# Patient Record
Sex: Female | Born: 1945 | Race: Black or African American | Hispanic: No | Smoking: Former smoker
Health system: Southern US, Community
[De-identification: ages and names within clinical notes are randomized; demographics above are authoritative.]

## PROBLEM LIST (undated history)

## (undated) DIAGNOSIS — K219 Gastro-esophageal reflux disease without esophagitis: Secondary | ICD-10-CM

## (undated) DIAGNOSIS — IMO0001 Reserved for inherently not codable concepts without codable children: Secondary | ICD-10-CM

## (undated) DIAGNOSIS — J45909 Unspecified asthma, uncomplicated: Secondary | ICD-10-CM

## (undated) DIAGNOSIS — E119 Type 2 diabetes mellitus without complications: Secondary | ICD-10-CM

## (undated) DIAGNOSIS — I639 Cerebral infarction, unspecified: Secondary | ICD-10-CM

## (undated) DIAGNOSIS — R42 Dizziness and giddiness: Secondary | ICD-10-CM

## (undated) DIAGNOSIS — G589 Mononeuropathy, unspecified: Secondary | ICD-10-CM

## (undated) DIAGNOSIS — I10 Essential (primary) hypertension: Secondary | ICD-10-CM

## (undated) DIAGNOSIS — R7303 Prediabetes: Secondary | ICD-10-CM

## (undated) DIAGNOSIS — S149XXA Injury of unspecified nerves of neck, initial encounter: Secondary | ICD-10-CM

## (undated) DIAGNOSIS — E785 Hyperlipidemia, unspecified: Secondary | ICD-10-CM

## (undated) DIAGNOSIS — K449 Diaphragmatic hernia without obstruction or gangrene: Secondary | ICD-10-CM

## (undated) HISTORY — PX: ABDOMINAL HYSTERECTOMY: SHX81

---

## 2009-08-28 ENCOUNTER — Emergency Department (HOSPITAL_BASED_OUTPATIENT_CLINIC_OR_DEPARTMENT_OTHER): Admission: EM | Admit: 2009-08-28 | Discharge: 2009-08-28 | Payer: Self-pay | Admitting: Emergency Medicine

## 2012-07-19 ENCOUNTER — Emergency Department (HOSPITAL_BASED_OUTPATIENT_CLINIC_OR_DEPARTMENT_OTHER)
Admission: EM | Admit: 2012-07-19 | Discharge: 2012-07-19 | Disposition: A | Discharge status: Home / Self Care | Payer: Self-pay | Attending: Emergency Medicine | Admitting: Emergency Medicine

## 2012-07-19 ENCOUNTER — Encounter (HOSPITAL_BASED_OUTPATIENT_CLINIC_OR_DEPARTMENT_OTHER): Payer: Self-pay | Admitting: *Deleted

## 2012-07-19 DIAGNOSIS — R220 Localized swelling, mass and lump, head: Secondary | ICD-10-CM | POA: Insufficient documentation

## 2012-07-19 DIAGNOSIS — R509 Fever, unspecified: Secondary | ICD-10-CM | POA: Insufficient documentation

## 2012-07-19 DIAGNOSIS — I10 Essential (primary) hypertension: Secondary | ICD-10-CM | POA: Insufficient documentation

## 2012-07-19 DIAGNOSIS — K047 Periapical abscess without sinus: Secondary | ICD-10-CM | POA: Insufficient documentation

## 2012-07-19 DIAGNOSIS — K089 Disorder of teeth and supporting structures, unspecified: Secondary | ICD-10-CM | POA: Insufficient documentation

## 2012-07-19 DIAGNOSIS — R6884 Jaw pain: Secondary | ICD-10-CM | POA: Insufficient documentation

## 2012-07-19 DIAGNOSIS — Z79899 Other long term (current) drug therapy: Secondary | ICD-10-CM | POA: Insufficient documentation

## 2012-07-19 DIAGNOSIS — R221 Localized swelling, mass and lump, neck: Secondary | ICD-10-CM | POA: Insufficient documentation

## 2012-07-19 HISTORY — DX: Essential (primary) hypertension: I10

## 2012-07-19 MED ORDER — OXYCODONE-ACETAMINOPHEN 5-325 MG PO TABS: 1.0000 | ORAL_TABLET | ORAL | Status: DC | PRN

## 2012-07-19 MED ORDER — AMOXICILLIN 500 MG PO CAPS: 500.0000 mg | ORAL_CAPSULE | Freq: Three times a day (TID) | ORAL | Status: DC

## 2012-07-19 MED ORDER — OXYCODONE-ACETAMINOPHEN 5-325 MG PO TABS
2.0000 | ORAL_TABLET | Freq: Once | ORAL | Status: DC
  Filled 2012-07-19: qty 2

## 2012-07-19 MED ORDER — AMOXICILLIN 500 MG PO CAPS: 500.0000 mg | ORAL_CAPSULE | Freq: Once | ORAL | Status: DC

## 2012-07-19 MED ORDER — NAPROXEN 500 MG PO TABS: 500.0000 mg | ORAL_TABLET | Freq: Two times a day (BID) | ORAL | Status: DC

## 2012-07-19 NOTE — ED Notes (Signed)
Pt c/o lower right jaw pain / toothache x 2 days

## 2012-07-20 NOTE — ED Provider Notes (Signed)
History     CSN: [id number]  Arrival date & time 07/19/12  2208   First MD Initiated Contact with Patient 07/19/12 2326      Chief Complaint  Patient presents with  . Dental Pain    (Consider location/radiation/quality/duration/timing/severity/associated sxs/prior treatment) HPI Comments: 66 year old female with a history of hypertension who presents with a complaint of lower jaw pain and swelling. This started approximately 2 days ago, has been persistent, gradually worsening and associated with subjective fever and chills but no nausea and vomiting. She has taken no medication prior to arrival, she denies any fractures of her teeth but notes that she has poor dentition at baseline. She has not seen a dentist in some time.  Patient is a 66 y.o. female presenting with tooth pain. The history is provided by the patient and a relative.  Dental PainThe primary symptoms include fever (subjective).  Additional symptoms include: facial swelling.    Past Medical History  Diagnosis Date  . Hypertension     Past Surgical History  Procedure Date  . Abdominal hysterectomy     History reviewed. No pertinent family history.  History  Substance Use Topics  . Smoking status: Current Every Day Smoker -- 0.5 packs/day  . Smokeless tobacco: Not on file  . Alcohol Use: No    OB History    Grav Para Term Preterm Abortions TAB SAB Ect Mult Living                  Review of Systems  Constitutional: Positive for fever (subjective) and chills.  HENT: Positive for facial swelling.   Gastrointestinal: Negative for nausea and vomiting.    Allergies  Codeine and Sulfa antibiotics  Home Medications   Current Outpatient Rx  Name Route Sig Dispense Refill  . HYDROCHLOROTHIAZIDE 25 MG PO TABS Oral Take 25 mg by mouth daily.    . AMOXICILLIN 500 MG PO CAPS Oral Take 1 capsule (500 mg total) by mouth 3 (three) times daily. 30 capsule 0  . NAPROXEN 500 MG PO TABS Oral Take 1 tablet (500 mg  total) by mouth 2 (two) times daily with a meal. 30 tablet 0  . OXYCODONE-ACETAMINOPHEN 5-325 MG PO TABS Oral Take 1 tablet by mouth every 4 (four) hours as needed for pain. 20 tablet 0    Pulse 97  Temp 99.7 F (37.6 C) (Oral)  Resp 16  Ht 5\' 3"  (1.6 m)  Wt 200 lb (90.719 kg)  BMI 35.43 kg/m2  SpO2 100%  Physical Exam  Nursing note and vitals reviewed. Constitutional: She appears well-developed and well-nourished. No distress.  HENT:  Head: Normocephalic and atraumatic.  Mouth/Throat: Oropharynx is clear and moist. No oropharyngeal exudate.       Dental Disease, swelling to the right lower jaw at the angle of the chin, in the intraoral area there is no fluctuant abscess, dentition is poor, no tenderness under the tongue, no swelling of the posterior pharynx  Eyes: Conjunctivae normal are normal. No scleral icterus.  Neck: Normal range of motion. Neck supple. No thyromegaly present.  Pulmonary/Chest: Effort normal.  Lymphadenopathy:    She has no cervical adenopathy.  Neurological: She is alert. Coordination normal.  Skin: Skin is warm and dry. No rash noted. She is not diaphoretic.    ED Course  Procedures (including critical care time)  Labs Reviewed - No data to display No results found.   1. Dental abscess       MDM  The patient has  a dental abscess which is small and not amenable to incision and drainage. At this time antibiotics are the best option. Vital signs reveal no fever or tachycardia and the patient has been able to swallow without difficulty. Given prescriptions for Naprosyn, amoxicillin and Percocet. Dental followup recommended       Vida Roller, MD 07/20/12 [facility]

## 2013-01-18 ENCOUNTER — Emergency Department (HOSPITAL_BASED_OUTPATIENT_CLINIC_OR_DEPARTMENT_OTHER)
Admission: EM | Admit: 2013-01-18 | Discharge: 2013-01-18 | Disposition: A | Discharge status: Home / Self Care | Payer: Self-pay | Attending: Emergency Medicine | Admitting: Emergency Medicine

## 2013-01-18 ENCOUNTER — Encounter (HOSPITAL_BASED_OUTPATIENT_CLINIC_OR_DEPARTMENT_OTHER): Payer: Self-pay | Admitting: Emergency Medicine

## 2013-01-18 DIAGNOSIS — Z792 Long term (current) use of antibiotics: Secondary | ICD-10-CM | POA: Insufficient documentation

## 2013-01-18 DIAGNOSIS — Z87891 Personal history of nicotine dependence: Secondary | ICD-10-CM | POA: Insufficient documentation

## 2013-01-18 DIAGNOSIS — Z79899 Other long term (current) drug therapy: Secondary | ICD-10-CM | POA: Insufficient documentation

## 2013-01-18 DIAGNOSIS — R51 Headache: Secondary | ICD-10-CM | POA: Insufficient documentation

## 2013-01-18 DIAGNOSIS — R110 Nausea: Secondary | ICD-10-CM | POA: Insufficient documentation

## 2013-01-18 DIAGNOSIS — I10 Essential (primary) hypertension: Secondary | ICD-10-CM | POA: Insufficient documentation

## 2013-01-18 DIAGNOSIS — R519 Headache, unspecified: Secondary | ICD-10-CM

## 2013-01-18 LAB — CBC WITH DIFFERENTIAL/PLATELET
Lymphocytes Relative: 62 % — ABNORMAL HIGH (ref 12–46)
Lymphs Abs: 5.6 10*3/uL — ABNORMAL HIGH (ref 0.7–4.0)
MCV: 82.6 fL (ref 78.0–100.0)
Neutrophils Relative %: 31 % — ABNORMAL LOW (ref 43–77)
Platelets: 297 10*3/uL (ref 150–400)
RBC: 5.22 MIL/uL — ABNORMAL HIGH (ref 3.87–5.11)
WBC: 9.1 10*3/uL (ref 4.0–10.5)

## 2013-01-18 LAB — COMPREHENSIVE METABOLIC PANEL
ALT: 28 U/L (ref 0–35)
Alkaline Phosphatase: 74 U/L (ref 39–117)
CO2: 28 mEq/L (ref 19–32)
GFR calc Af Amer: 77 mL/min — ABNORMAL LOW (ref >90)
GFR calc non Af Amer: 66 mL/min — ABNORMAL LOW (ref >90)
Glucose, Bld: 105 mg/dL — ABNORMAL HIGH (ref 70–99)
Potassium: 3.8 mEq/L (ref 3.5–5.1)
Sodium: 140 mEq/L (ref 135–145)

## 2013-01-18 MED ORDER — PREDNISONE 10 MG PO TABS: 20.0000 mg | ORAL_TABLET | Freq: Two times a day (BID) | ORAL | Status: DC

## 2013-01-18 MED ORDER — HYDROCODONE-ACETAMINOPHEN 5-325 MG PO TABS: 2.0000 | ORAL_TABLET | ORAL | Status: DC | PRN

## 2013-01-18 NOTE — ED Provider Notes (Signed)
History     CSN: [id number]  Arrival date & time 01/18/13  1911   First MD Initiated Contact with Patient 01/18/13 1927      Chief Complaint  Patient presents with  . Headache  . Nausea    (Consider location/radiation/quality/duration/timing/severity/associated sxs/prior treatment) HPI Comments: Patient presents with complaints of intermittent, sharp pains that occur in the back of her head and radiate into her neck and feet.  This has happened several times over the past several months.  It seems to last about 15 minutes but nothing aggravates or alleviates the symptoms.  She has been seen at the ER and by her pcp for this and has had a ct scan of her head and labs, only revealing a wbc count of 11k.    Patient is a 67 y.o. female presenting with headaches. The history is provided by the patient.  Headache Pain location:  Occipital Quality:  Sharp Radiates to: neck and feet. Onset quality:  Sudden Timing:  Intermittent Progression:  Worsening   Past Medical History  Diagnosis Date  . Hypertension     Past Surgical History  Procedure Laterality Date  . Abdominal hysterectomy      No family history on file.  History  Substance Use Topics  . Smoking status: Former Smoker -- 0.50 packs/day    Quit date: 12/21/2012  . Smokeless tobacco: Not on file  . Alcohol Use: No    OB History   Grav Para Term Preterm Abortions TAB SAB Ect Mult Living                  Review of Systems  Neurological: Positive for headaches.  All other systems reviewed and are negative.    Allergies  Codeine; Penicillins; and Sulfa antibiotics  Home Medications   Current Outpatient Rx  Name  Route  Sig  Dispense  Refill  . hydrochlorothiazide (HYDRODIURIL) 25 MG tablet   Oral   Take 25 mg by mouth daily.         Marland Kitchen lisinopril (PRINIVIL,ZESTRIL) 10 MG tablet   Oral   Take 10 mg by mouth daily.         Marland Kitchen amoxicillin (AMOXIL) 500 MG capsule   Oral   Take 1 capsule (500 mg  total) by mouth 3 (three) times daily.   30 capsule   0   . naproxen (NAPROSYN) 500 MG tablet   Oral   Take 1 tablet (500 mg total) by mouth 2 (two) times daily with a meal.   30 tablet   0   . oxyCODONE-acetaminophen (PERCOCET) 5-325 MG per tablet   Oral   Take 1 tablet by mouth every 4 (four) hours as needed for pain.   20 tablet   0     BP 116/62  Pulse 71  Temp(Src) 98 F (36.7 C) (Oral)  Resp 16  Ht 5\' 2"  (1.575 m)  Wt 178 lb (80.74 kg)  BMI 32.55 kg/m2  SpO2 100%  Physical Exam  Nursing note and vitals reviewed. Constitutional: She is oriented to person, place, and time. She appears well-developed and well-nourished. No distress.  HENT:  Head: Normocephalic and atraumatic.  Eyes: EOM are normal. Pupils are equal, round, and reactive to light.  Neck: Normal range of motion. Neck supple.  Cardiovascular: Normal rate and regular rhythm.  Exam reveals no gallop and no friction rub.   No murmur heard. Pulmonary/Chest: Effort normal and breath sounds normal. No respiratory distress. She has no wheezes.  Abdominal: Soft. Bowel sounds are normal. She exhibits no distension. There is no tenderness.  Musculoskeletal: Normal range of motion.  Neurological: She is alert and oriented to person, place, and time. No cranial nerve deficit. She exhibits normal muscle tone. Coordination normal.  Skin: Skin is warm and dry. She is not diaphoretic.    ED Course  Procedures (including critical care time)  Labs Reviewed - No data to display No results found.   No diagnosis found.   Date: 01/18/2013  Rate: 68  Rhythm: normal sinus rhythm  QRS Axis: normal  Intervals: normal  ST/T Wave abnormalities: normal  Conduction Disutrbances:none  Narrative Interpretation:   Old EKG Reviewed: unchanged    MDM  The patient presents with intermittent sharp, shooting pains in the back of her head.  I am unable to find a reason for this.  The labs, ekg, and ct at the outside  facility are unremarkable.  She is to see a neurologist in the near future and I have recommended that she makes sure this happens.  Will discharge to home, return prn.        Geoffery Lyons, MD 01/18/13 2107

## 2013-01-18 NOTE — ED Notes (Signed)
Sent to [facility] for Head CT result that was done yesterday per pt.

## 2013-01-18 NOTE — ED Notes (Signed)
Intermittent posterior HA radiating down back of neck with dizziness and nausea.  Lost consciousness x1 6 days ago.  Sts she was evaluated [facility] and had a "scan and blood work".  Pt was told that all tests were negative except WBC 11.  Another episode today.

## 2013-02-18 ENCOUNTER — Encounter (HOSPITAL_BASED_OUTPATIENT_CLINIC_OR_DEPARTMENT_OTHER): Payer: Self-pay | Admitting: *Deleted

## 2013-02-18 ENCOUNTER — Emergency Department (HOSPITAL_BASED_OUTPATIENT_CLINIC_OR_DEPARTMENT_OTHER): Payer: Self-pay

## 2013-02-18 ENCOUNTER — Emergency Department (HOSPITAL_BASED_OUTPATIENT_CLINIC_OR_DEPARTMENT_OTHER)
Admission: EM | Admit: 2013-02-18 | Discharge: 2013-02-18 | Disposition: A | Discharge status: Home / Self Care | Payer: Self-pay | Attending: Emergency Medicine | Admitting: Emergency Medicine

## 2013-02-18 DIAGNOSIS — R6883 Chills (without fever): Secondary | ICD-10-CM | POA: Insufficient documentation

## 2013-02-18 DIAGNOSIS — Z79899 Other long term (current) drug therapy: Secondary | ICD-10-CM | POA: Insufficient documentation

## 2013-02-18 DIAGNOSIS — R110 Nausea: Secondary | ICD-10-CM | POA: Insufficient documentation

## 2013-02-18 DIAGNOSIS — R42 Dizziness and giddiness: Secondary | ICD-10-CM | POA: Insufficient documentation

## 2013-02-18 DIAGNOSIS — Z87891 Personal history of nicotine dependence: Secondary | ICD-10-CM | POA: Insufficient documentation

## 2013-02-18 DIAGNOSIS — R079 Chest pain, unspecified: Secondary | ICD-10-CM | POA: Insufficient documentation

## 2013-02-18 DIAGNOSIS — IMO0002 Reserved for concepts with insufficient information to code with codable children: Secondary | ICD-10-CM | POA: Insufficient documentation

## 2013-02-18 DIAGNOSIS — M5412 Radiculopathy, cervical region: Secondary | ICD-10-CM

## 2013-02-18 DIAGNOSIS — Z8669 Personal history of other diseases of the nervous system and sense organs: Secondary | ICD-10-CM | POA: Insufficient documentation

## 2013-02-18 DIAGNOSIS — I10 Essential (primary) hypertension: Secondary | ICD-10-CM | POA: Insufficient documentation

## 2013-02-18 HISTORY — DX: Dizziness and giddiness: R42

## 2013-02-18 LAB — COMPREHENSIVE METABOLIC PANEL
AST: 23 U/L (ref 0–37)
Albumin: 3.7 g/dL (ref 3.5–5.2)
Chloride: 105 mEq/L (ref 96–112)
Creatinine, Ser: 0.8 mg/dL (ref 0.50–1.10)
Potassium: 4.3 mEq/L (ref 3.5–5.1)
Total Bilirubin: 0.5 mg/dL (ref 0.3–1.2)
Total Protein: 7.2 g/dL (ref 6.0–8.3)

## 2013-02-18 LAB — CBC WITH DIFFERENTIAL/PLATELET
Basophils Absolute: 0 10*3/uL (ref 0.0–0.1)
Basophils Relative: 1 % (ref 0–1)
MCHC: 33.5 g/dL (ref 30.0–36.0)
Monocytes Absolute: 0.4 10*3/uL (ref 0.1–1.0)
Neutro Abs: 1.8 10*3/uL (ref 1.7–7.7)
Neutrophils Relative %: 31 % — ABNORMAL LOW (ref 43–77)
RDW: 16.2 % — ABNORMAL HIGH (ref 11.5–15.5)

## 2013-02-18 LAB — D-DIMER, QUANTITATIVE: D-Dimer, Quant: 0.38 ug/mL-FEU (ref 0.00–0.48)

## 2013-02-18 LAB — URINALYSIS, ROUTINE W REFLEX MICROSCOPIC
Glucose, UA: NEGATIVE mg/dL
Ketones, ur: NEGATIVE mg/dL
Leukocytes, UA: NEGATIVE
Specific Gravity, Urine: 1.018 (ref 1.005–1.030)
pH: 7.5 (ref 5.0–8.0)

## 2013-02-18 MED ORDER — OXYCODONE-ACETAMINOPHEN 5-325 MG PO TABS: 2.0000 | ORAL_TABLET | ORAL | Status: DC | PRN

## 2013-02-18 MED ORDER — IBUPROFEN 800 MG PO TABS: 800.0000 mg | ORAL_TABLET | Freq: Three times a day (TID) | ORAL | Status: DC

## 2013-02-18 MED ORDER — METHYLPREDNISOLONE 4 MG PO KIT: PACK | ORAL | Status: DC

## 2013-02-18 NOTE — ED Notes (Signed)
Ambulated patient as ordered. SAT was 96% in bed before we started, and increased to 99% with walking. Patient stated that she felt ok with walking. RT will continue to monitor.

## 2013-02-18 NOTE — ED Notes (Signed)
MD at bedside. 

## 2013-02-18 NOTE — ED Provider Notes (Signed)
History    This chart was scribed for Glynn Octave, MD by Leone Payor, ED Scribe. This patient was seen in room MH11/MH11 and the patient's care was started 3:23 PM.   CSN: [id number]  Arrival date & time 02/18/13  1400   First MD Initiated Contact with Patient 02/18/13 1459      Chief Complaint  Patient presents with  . Shortness of Breath  . Chills  . Chest Pain     The history is provided by the patient. No language interpreter was used.    Donna Frank is a 67 y.o. female who presents to the Emergency Department complaining of new, sudden onset of intermittent SOB that started about 1 hour ago. Pt states she feels tightness in her chest but no pain. Pt also has left neck pain which she believes is from a pinched nerve that has been going on for a while. States the chest tightness also has been associated with the nerve pain but it comes and goes. Pt states she sometimes feels lightheadedness but no dizziness. She has coughing and occasional nausea. She denies chest pain, fever, runny nose, sore throat, change in bowel or bladder function, weakness in the left arm. Pt has h/o HTN. Denies heart and lung problems. She denies smoking. Denies taking any long plane or car trips. Pt is eating and drinking normally.   Pt is a former smoker but denies alcohol use. Past Medical History  Diagnosis Date  . Hypertension   . Vertigo     Past Surgical History  Procedure Laterality Date  . Abdominal hysterectomy      History reviewed. No pertinent family history.  History  Substance Use Topics  . Smoking status: Former Smoker -- 0.50 packs/day    Quit date: 12/21/2012  . Smokeless tobacco: Not on file  . Alcohol Use: No    No OB history provided.   Review of Systems A complete 10 system review of systems was obtained and all systems are negative except as noted in the HPI and PMH.   Allergies  Codeine; Penicillins; and Sulfa antibiotics  Home Medications   Current  Outpatient Rx  Name  Route  Sig  Dispense  Refill  . meclizine (ANTIVERT) 25 MG tablet   Oral   Take 25 mg by mouth 3 (three) times daily as needed.         Marland Kitchen amoxicillin (AMOXIL) 500 MG capsule   Oral   Take 1 capsule (500 mg total) by mouth 3 (three) times daily.   30 capsule   0   . hydrochlorothiazide (HYDRODIURIL) 25 MG tablet   Oral   Take 25 mg by mouth daily.         Marland Kitchen HYDROcodone-acetaminophen (NORCO) 5-325 MG per tablet   Oral   Take 2 tablets by mouth every 4 (four) hours as needed for pain.   20 tablet   0   . ibuprofen (ADVIL,MOTRIN) 800 MG tablet   Oral   Take 1 tablet (800 mg total) by mouth 3 (three) times daily.   21 tablet   0   . lisinopril (PRINIVIL,ZESTRIL) 10 MG tablet   Oral   Take 10 mg by mouth daily.         . methylPREDNISolone (MEDROL, PAK,) 4 MG tablet      follow package directions   21 tablet   0   . naproxen (NAPROSYN) 500 MG tablet   Oral   Take 1 tablet (500 mg total) by  mouth 2 (two) times daily with a meal.   30 tablet   0   . oxyCODONE-acetaminophen (PERCOCET) 5-325 MG per tablet   Oral   Take 1 tablet by mouth every 4 (four) hours as needed for pain.   20 tablet   0   . oxyCODONE-acetaminophen (PERCOCET/ROXICET) 5-325 MG per tablet   Oral   Take 2 tablets by mouth every 4 (four) hours as needed for pain.   15 tablet   0   . predniSONE (DELTASONE) 10 MG tablet   Oral   Take 2 tablets (20 mg total) by mouth 2 (two) times daily.   20 tablet   0     BP 132/76  Pulse 75  Temp(Src) 98.5 F (36.9 C) (Oral)  Ht 5\' 2"  (1.575 m)  Wt 184 lb (83.462 kg)  BMI 33.65 kg/m2  SpO2 99%  Physical Exam  Nursing note and vitals reviewed. Constitutional: She is oriented to person, place, and time. She appears well-developed and well-nourished. No distress.  HENT:  Head: Normocephalic and atraumatic.  Eyes: EOM are normal.  Neck: Neck supple. No tracheal deviation present.  Cardiovascular: Normal rate.    Pulmonary/Chest: Effort normal and breath sounds normal. No respiratory distress.  Musculoskeletal: Normal range of motion.  Left paraspinal cervical pain. equal grip strength bilaterally. 5/5 strength in bilateral lower extremities. Ankle plantar and dorsiflexion intact. Great toe extension intact bilaterally. +2 DP and PT pulses. +2 patellar reflexes bilaterally. Normal gait.    Neurological: She is alert and oriented to person, place, and time.  cranial nerves 2-12 intact. 5/5 strength throughout, no ataxia on finger to nose. Romberg neg. Normal gait. No nystagmus.   Skin: Skin is warm and dry.  Psychiatric: She has a normal mood and affect. Her behavior is normal.    ED Course  Procedures (including critical care time)  DIAGNOSTIC STUDIES: Oxygen Saturation is 96% on room air, adequate by my interpretation.    COORDINATION OF CARE: 3:28 PM-Discussed treatment plan with pt at bedside and pt agreed to plan.    4:28PM-Informed pt of lab and radiology reports. Pt advised to follow up with a neurosurgeon. Pt will be given steroids and antiinflammatory medication.   Labs Reviewed  CBC WITH DIFFERENTIAL - Abnormal; Notable for the following:    RDW 16.2 (*)    Neutrophils Relative 31 (*)    Lymphocytes Relative 60 (*)    All other components within normal limits  COMPREHENSIVE METABOLIC PANEL - Abnormal; Notable for the following:    GFR calc non Af Amer 76 (*)    GFR calc Af Amer 88 (*)    All other components within normal limits  TROPONIN I  URINALYSIS, ROUTINE W REFLEX MICROSCOPIC  D-DIMER, QUANTITATIVE   Dg Chest 2 View  02/18/2013  *RADIOLOGY REPORT*  Clinical Data: Shortness of breath and chills  CHEST - 2 VIEW  Comparison: None  Findings: The heart size and mediastinal contours are within normal limits.  Both lungs are clear.  The visualized skeletal structures are unremarkable.  IMPRESSION: Negative examination.   Original Report Authenticated By: Signa Kell, M.D.     Ct Cervical Spine Wo Contrast  02/18/2013  *RADIOLOGY REPORT*  Clinical Data: Left-sided neck pain and numbness in the left upper extremity.  CT CERVICAL SPINE WITHOUT CONTRAST  Technique:  Multidetector CT imaging of the cervical spine was performed. Multiplanar CT image reconstructions were also generated.  Comparison: None.  Findings: Mild multilevel cervical spondylosis at C4-5, C5-6  and C6- 7 with central disc bulges/protrusions evident on soft tissue windows.  Proliferative changes are also present at multiple levels.  No evidence of fracture or subluxation.  No soft tissue abnormalities.  IMPRESSION: Mild multilevel cervical spondylosis, as above.  Soft tissue windows suggest potential disc bulges/protrusions which are not very well delineated by CT.  Consider cervical MRI if there are persistent radicular symptoms.   Original Report Authenticated By: Irish Lack, M.D.      1. Cervical radiculopathy       MDM  Onset of chills and shortness of breath today. No chest pain, cough or fever. Complains of left-sided neck pain which she says is due to a pinched nerve that has been going on for "a long time. No grip strength weakness, fever, chills, nausea or vomiting.  Chest x-ray negative. EKG unchanged. Troponin and d-dimer negative. Patient ambulatory without desaturation. Suspect the sensation she had was due to her pinched nerve in her left neck. She is no grip strength weakness has not been dropping objects.  Do not suspect ACS or PE. We'll treat for cervical radiculopathy. She has followup with neurology and neurosurgery scheduled.   Date: 02/18/2013  Rate: 81  Rhythm: normal sinus rhythm  QRS Axis: normal  Intervals: normal  ST/T Wave abnormalities: normal  Conduction Disutrbances:none  Narrative Interpretation: septal q waves  Old EKG Reviewed: unchanged    I personally performed the services described in this documentation, which was scribed in my presence. The recorded  information has been reviewed and is accurate.     Glynn Octave, MD 02/18/13 [phone]

## 2013-02-18 NOTE — ED Notes (Signed)
Pt to triage in w/c c/o "feeling cold all over" and sudden onset of sob x 1 hour ago. Pt also c/o left neck pain, "I have a pinched nerve, that's been going on for a while now..."

## 2013-02-18 NOTE — ED Notes (Signed)
Patient ambulated to and from restroom without difficulty.

## 2013-03-07 ENCOUNTER — Encounter (HOSPITAL_BASED_OUTPATIENT_CLINIC_OR_DEPARTMENT_OTHER): Payer: Self-pay | Admitting: Emergency Medicine

## 2013-03-07 ENCOUNTER — Emergency Department (HOSPITAL_BASED_OUTPATIENT_CLINIC_OR_DEPARTMENT_OTHER)
Admission: EM | Admit: 2013-03-07 | Discharge: 2013-03-08 | Disposition: A | Discharge status: Home / Self Care | Payer: BC Managed Care – PPO | Attending: Emergency Medicine | Admitting: Emergency Medicine

## 2013-03-07 DIAGNOSIS — I10 Essential (primary) hypertension: Secondary | ICD-10-CM | POA: Insufficient documentation

## 2013-03-07 DIAGNOSIS — Z79899 Other long term (current) drug therapy: Secondary | ICD-10-CM | POA: Insufficient documentation

## 2013-03-07 DIAGNOSIS — M5412 Radiculopathy, cervical region: Secondary | ICD-10-CM

## 2013-03-07 DIAGNOSIS — Z8669 Personal history of other diseases of the nervous system and sense organs: Secondary | ICD-10-CM | POA: Insufficient documentation

## 2013-03-07 DIAGNOSIS — F411 Generalized anxiety disorder: Secondary | ICD-10-CM | POA: Insufficient documentation

## 2013-03-07 DIAGNOSIS — E785 Hyperlipidemia, unspecified: Secondary | ICD-10-CM | POA: Insufficient documentation

## 2013-03-07 DIAGNOSIS — Z880 Allergy status to penicillin: Secondary | ICD-10-CM | POA: Insufficient documentation

## 2013-03-07 DIAGNOSIS — Z87891 Personal history of nicotine dependence: Secondary | ICD-10-CM | POA: Insufficient documentation

## 2013-03-07 DIAGNOSIS — F410 Panic disorder [episodic paroxysmal anxiety] without agoraphobia: Secondary | ICD-10-CM

## 2013-03-07 HISTORY — DX: Hyperlipidemia, unspecified: E78.5

## 2013-03-07 NOTE — ED Notes (Signed)
C/o SHOB and neck pain for "months".  Seen here in the past for same and told she has a pinched nerve.  Daughter thinks she is having panic attacks.  Appt with neurology on 03/20/13.

## 2013-03-07 NOTE — ED Provider Notes (Signed)
History     CSN: [id number]  Arrival date & time 03/07/13  2143   First MD Initiated Contact with Patient 03/07/13 2346      Chief Complaint  Patient presents with  . Neck Pain    (Consider location/radiation/quality/duration/timing/severity/associated sxs/prior treatment) HPI This is a 67 year old female with several months of neck pain. The pain is located in the left neck and radiates to her left ear, her left scapula and her left shoulder. It is episodic and triggered by lifting with her left arm. It has not significantly affected by movement of the neck itself. She describes the pain is sharp and moderate to severe. She has an appointment with a neurologist on May 21. She has been taking ibuprofen, naproxen and/or oxycodone with relief.  She is also here because she's been having panic attacks. These are triggered by being alone. If she knows her son is going to work and she will be a home alone this will trigger an attack. Episodes of neck pain also trigger her attacks. Sometimes the attacks occur without obvious provocation. The attacks are characterized as feeling short of breath, very anxious with paresthesias in the fingertips. She had a workup in the ED on April 21 which showed no evidence of acute cardiac or pulmonary disease. She was discharged with diagnosis of cervical radiculopathy.  Past Medical History  Diagnosis Date  . Hypertension   . Vertigo   . Hyperlipidemia     Past Surgical History  Procedure Laterality Date  . Abdominal hysterectomy      No family history on file.  History  Substance Use Topics  . Smoking status: Former Smoker -- 0.50 packs/day    Quit date: 12/21/2012  . Smokeless tobacco: Not on file  . Alcohol Use: No    OB History   Grav Para Term Preterm Abortions TAB SAB Ect Mult Living                  Review of Systems  All other systems reviewed and are negative.    Allergies  Codeine; Penicillins; and Sulfa antibiotics  Home  Medications   Current Outpatient Rx  Name  Route  Sig  Dispense  Refill  . amoxicillin (AMOXIL) 500 MG capsule   Oral   Take 1 capsule (500 mg total) by mouth 3 (three) times daily.   30 capsule   0   . hydrochlorothiazide (HYDRODIURIL) 25 MG tablet   Oral   Take 25 mg by mouth daily.         Marland Kitchen HYDROcodone-acetaminophen (NORCO) 5-325 MG per tablet   Oral   Take 2 tablets by mouth every 4 (four) hours as needed for pain.   20 tablet   0   . ibuprofen (ADVIL,MOTRIN) 800 MG tablet   Oral   Take 1 tablet (800 mg total) by mouth 3 (three) times daily.   21 tablet   0   . lisinopril (PRINIVIL,ZESTRIL) 10 MG tablet   Oral   Take 10 mg by mouth daily.         . meclizine (ANTIVERT) 25 MG tablet   Oral   Take 25 mg by mouth 3 (three) times daily as needed.         . methylPREDNISolone (MEDROL, PAK,) 4 MG tablet      follow package directions   21 tablet   0   . naproxen (NAPROSYN) 500 MG tablet   Oral   Take 1 tablet (500 mg total) by  mouth 2 (two) times daily with a meal.   30 tablet   0   . oxyCODONE-acetaminophen (PERCOCET) 5-325 MG per tablet   Oral   Take 1 tablet by mouth every 4 (four) hours as needed for pain.   20 tablet   0   . oxyCODONE-acetaminophen (PERCOCET/ROXICET) 5-325 MG per tablet   Oral   Take 2 tablets by mouth every 4 (four) hours as needed for pain.   15 tablet   0   . predniSONE (DELTASONE) 10 MG tablet   Oral   Take 2 tablets (20 mg total) by mouth 2 (two) times daily.   20 tablet   0     BP 158/67  Pulse 77  Temp(Src) 98.5 F (36.9 C) (Oral)  Resp 18  Ht 5\' 2"  (1.575 m)  Wt 180 lb (81.647 kg)  BMI 32.91 kg/m2  SpO2 98%  Physical Exam General: Well-developed, well-nourished female in no acute distress; appearance consistent with age of record HENT: normocephalic, atraumatic Eyes: pupils equal round and reactive to light; extraocular muscles intact Neck: supple; pain when lifting with left arm Heart: regular rate  and rhythm Lungs: clear to auscultation bilaterally Abdomen: soft; nondistended Extremities: No deformity; full range of motion Neurologic: Awake, alert and oriented; motor function intact in all extremities and symmetric; no facial droop Skin: Warm and dry Psychiatric: Mildly anxious    ED Course  Procedures (including critical care time)    MDM  We will schedule the patient for outpatient MRI and anticipation of her neurology appointment. We'll prescribe a mild anxiolytic for use as needed.        Hanley Seamen, MD 03/08/13 [phone]

## 2013-03-08 MED ORDER — OXYCODONE-ACETAMINOPHEN 5-325 MG PO TABS: 1.0000 | ORAL_TABLET | Freq: Four times a day (QID) | ORAL | Status: DC | PRN

## 2013-03-08 MED ORDER — ALPRAZOLAM 0.25 MG PO TABS: 0.2500 mg | ORAL_TABLET | Freq: Three times a day (TID) | ORAL | Status: DC | PRN

## 2013-03-13 MED ORDER — LORAZEPAM 1 MG PO TABS: 1.0000 mg | ORAL_TABLET | Freq: Once | ORAL | Status: DC

## 2013-03-13 NOTE — ED Notes (Signed)
XR tech sts pt called and is claustrophobic and needs medication to have mri this Saturday. Dr Rosalia Hammers reviewed chart and prescription for ativan provided by Dr. Rosalia Hammers for pt's procedure. Pt called and notified of prescription that will be waiting for her in envelope with name on it. Pt aware she will need to provide photo ID and sts she will come to pick it up later today.

## 2013-03-16 ENCOUNTER — Ambulatory Visit (HOSPITAL_BASED_OUTPATIENT_CLINIC_OR_DEPARTMENT_OTHER): Payer: BC Managed Care – PPO

## 2013-05-21 ENCOUNTER — Emergency Department (HOSPITAL_BASED_OUTPATIENT_CLINIC_OR_DEPARTMENT_OTHER): Payer: BC Managed Care – PPO

## 2013-05-21 ENCOUNTER — Emergency Department (HOSPITAL_BASED_OUTPATIENT_CLINIC_OR_DEPARTMENT_OTHER)
Admission: EM | Admit: 2013-05-21 | Discharge: 2013-05-21 | Disposition: A | Discharge status: Home / Self Care | Payer: BC Managed Care – PPO | Attending: Emergency Medicine | Admitting: Emergency Medicine

## 2013-05-21 ENCOUNTER — Encounter (HOSPITAL_BASED_OUTPATIENT_CLINIC_OR_DEPARTMENT_OTHER): Payer: Self-pay | Admitting: *Deleted

## 2013-05-21 DIAGNOSIS — Z8669 Personal history of other diseases of the nervous system and sense organs: Secondary | ICD-10-CM | POA: Insufficient documentation

## 2013-05-21 DIAGNOSIS — Z79899 Other long term (current) drug therapy: Secondary | ICD-10-CM | POA: Insufficient documentation

## 2013-05-21 DIAGNOSIS — I10 Essential (primary) hypertension: Secondary | ICD-10-CM | POA: Insufficient documentation

## 2013-05-21 DIAGNOSIS — Z880 Allergy status to penicillin: Secondary | ICD-10-CM | POA: Insufficient documentation

## 2013-05-21 DIAGNOSIS — M25519 Pain in unspecified shoulder: Secondary | ICD-10-CM | POA: Insufficient documentation

## 2013-05-21 DIAGNOSIS — M25512 Pain in left shoulder: Secondary | ICD-10-CM

## 2013-05-21 DIAGNOSIS — Z87891 Personal history of nicotine dependence: Secondary | ICD-10-CM | POA: Insufficient documentation

## 2013-05-21 DIAGNOSIS — Z862 Personal history of diseases of the blood and blood-forming organs and certain disorders involving the immune mechanism: Secondary | ICD-10-CM | POA: Insufficient documentation

## 2013-05-21 DIAGNOSIS — R079 Chest pain, unspecified: Secondary | ICD-10-CM

## 2013-05-21 DIAGNOSIS — Z8639 Personal history of other endocrine, nutritional and metabolic disease: Secondary | ICD-10-CM | POA: Insufficient documentation

## 2013-05-21 HISTORY — DX: Mononeuropathy, unspecified: G58.9

## 2013-05-21 HISTORY — DX: Injury of unspecified nerves of neck, initial encounter: S14.9XXA

## 2013-05-21 MED ORDER — OXYCODONE-ACETAMINOPHEN 5-325 MG PO TABS: 1.0000 | ORAL_TABLET | ORAL | Status: DC | PRN

## 2013-05-21 MED ORDER — OXYCODONE-ACETAMINOPHEN 5-325 MG PO TABS
2.0000 | ORAL_TABLET | Freq: Once | ORAL | Status: AC
  Administered 2013-05-21: 2 via ORAL
  Filled 2013-05-21 (×2): qty 2

## 2013-05-21 NOTE — ED Provider Notes (Signed)
History    CSN: [id number] Arrival date & time 05/21/13  0205  First MD Initiated Contact with Patient 05/21/13 [phone]     Chief Complaint  Patient presents with  . Back Pain    Patient is a 67 y.o. female presenting with back pain. The history is provided by the patient.  Back Pain Pain location: left scapular region. Quality:  Stabbing Radiates to: left chest. Pain severity:  Moderate Pain is:  Same all the time Duration:  1 day Timing:  Constant Progression:  Worsening Chronicity:  Recurrent Relieved by:  Nothing Worsened by:  Deep breathing, touching and movement Associated symptoms: chest pain   Associated symptoms: no abdominal pain, no fever and no weakness   pt reports she has had left scapular pain that radiates to left chest that has been present for one day.  It has been constant for 24 hours.  No trauma/injury.  Pain seems worse with deep breathing and movement/palpation She reports mild SOB when pain is severe No cough No focal weakness No abdominal pain She reports recent 3 day stay at [facility] regional and had full cardiac workup for similar type pain She denies known h/o CAD/PE  Past Medical History  Diagnosis Date  . Hypertension   . Vertigo   . Hyperlipidemia   . Pinched nerve in neck     neck and shoulder   Past Surgical History  Procedure Laterality Date  . Abdominal hysterectomy     History reviewed. No pertinent family history. History  Substance Use Topics  . Smoking status: Former Smoker -- 0.50 packs/day    Quit date: 12/21/2012  . Smokeless tobacco: Not on file  . Alcohol Use: No   OB History   Grav Para Term Preterm Abortions TAB SAB Ect Mult Living                 Review of Systems  Constitutional: Negative for fever.  Cardiovascular: Positive for chest pain. Negative for leg swelling.  Gastrointestinal: Negative for vomiting and abdominal pain.  Musculoskeletal: Positive for back pain.  Neurological: Negative for weakness.   All other systems reviewed and are negative.    Allergies  Codeine; Penicillins; and Sulfa antibiotics  Home Medications   Current Outpatient Rx  Name  Route  Sig  Dispense  Refill  . ALPRAZolam (XANAX) 0.25 MG tablet   Oral   Take 1 tablet (0.25 mg total) by mouth 3 (three) times daily as needed for anxiety.   20 tablet   0   . esomeprazole (NEXIUM) 40 MG capsule   Oral   Take 40 mg by mouth daily before breakfast.         . hydrochlorothiazide (HYDRODIURIL) 25 MG tablet   Oral   Take 25 mg by mouth daily.         Marland Kitchen lisinopril (PRINIVIL,ZESTRIL) 10 MG tablet   Oral   Take 10 mg by mouth daily.         . tizanidine (ZANAFLEX) 2 MG capsule   Oral   Take 2 mg by mouth 3 (three) times daily.         Marland Kitchen amoxicillin (AMOXIL) 500 MG capsule   Oral   Take 1 capsule (500 mg total) by mouth 3 (three) times daily.   30 capsule   0   . HYDROcodone-acetaminophen (NORCO) 5-325 MG per tablet   Oral   Take 2 tablets by mouth every 4 (four) hours as needed for pain.  20 tablet   0   . ibuprofen (ADVIL,MOTRIN) 800 MG tablet   Oral   Take 1 tablet (800 mg total) by mouth 3 (three) times daily.   21 tablet   0   . LORazepam (ATIVAN) 1 MG tablet   Oral   Take 1 tablet (1 mg total) by mouth once. Take two mg 30 minutes prior to arrival for mri.  Do not drive and stay accompanied by other person for 24 hours after taking.   2 tablet   0   . meclizine (ANTIVERT) 25 MG tablet   Oral   Take 25 mg by mouth 3 (three) times daily as needed.         . methylPREDNISolone (MEDROL, PAK,) 4 MG tablet      follow package directions   21 tablet   0   . naproxen (NAPROSYN) 500 MG tablet   Oral   Take 1 tablet (500 mg total) by mouth 2 (two) times daily with a meal.   30 tablet   0   . oxyCODONE-acetaminophen (PERCOCET) 5-325 MG per tablet   Oral   Take 1 tablet by mouth every 4 (four) hours as needed for pain.   20 tablet   0   . oxyCODONE-acetaminophen  (PERCOCET/ROXICET) 5-325 MG per tablet   Oral   Take 1-2 tablets by mouth every 6 (six) hours as needed for pain.   20 tablet   0   . predniSONE (DELTASONE) 10 MG tablet   Oral   Take 2 tablets (20 mg total) by mouth 2 (two) times daily.   20 tablet   0    BP 155/77  Pulse 76  Temp(Src) 98.7 F (37.1 C) (Oral)  Resp 19  Ht 5\' 2"  (1.575 m)  Wt 184 lb (83.462 kg)  BMI 33.65 kg/m2  SpO2 98% Physical Exam CONSTITUTIONAL: Well developed/well nourished HEAD: Normocephalic/atraumatic EYES: EOMI/PERRL ENMT: Mucous membranes moist NECK: supple no meningeal signs SPINE:entire spine nontender CV: S1/S2 noted, no murmurs/rubs/gallops noted Chest - tender to left chest, no bruising or crepitance LUNGS: Lungs are clear to auscultation bilaterally, no apparent distress ABDOMEN: soft, nontender, no rebound or guarding GU:no cva tenderness NEURO: Pt is awake/alert, moves all extremitiesx4 EXTREMITIES: pulses normal, full ROM. Tender to left scapula but no deformity or bruising.  She has full ROM of left shoulder. She is tender along left trapezius.   SKIN: warm, color normal PSYCH: no abnormalities of mood noted  ED Course  Procedures  2:55 AM Awaiting records from [facility] 3:28 AM Records from [facility] reviewed Pt had admission earlier this month for SOB She had cardiac evaluation with negative troponins as well as negative cardiac imaging though it was at rest only as pt refused stress imaging.  She also had EGD that showed small hiatal hernia 3:48 AM Pt now admits some of this pain is similar to prior episodes of "pinched nerve" in her neck causing pain into left arm. She has no new weakness in her arm.  Her CP continues to be reproducible.  With recent negative workup at [facility] and her exam/history, I don't feel further workup necessary.  I doubt ACS/Pe/Dissection at this time Short course of pain meds given We discussed strict return precautions  MDM  Nursing notes  including past medical history and social history reviewed and considered in documentation xrays reviewed and considered     Date: 05/21/2013 0238am  Rate: 75  Rhythm: normal sinus rhythm  QRS Axis: normal  Intervals: normal  ST/T Wave abnormalities: nonspecific ST changes  Conduction Disutrbances:none  Narrative Interpretation:   Old EKG Reviewed: unchanged        Joya Gaskins, MD 05/21/13 [phone]

## 2013-05-21 NOTE — ED Notes (Addendum)
medicated per order for pain, Dr Bebe Shaggy into see pt pain dx as musculoskeletal after review of [facility] records and current test results.

## 2013-05-21 NOTE — ED Notes (Signed)
Pt c/o upper back pain for few days, was seen by PA at [facility] for same but states she can't take the meds they prescribed her. This is a chronic issue.

## 2013-05-21 NOTE — ED Notes (Addendum)
Request for medical records from [facility] per Dr Bebe Shaggy signed by patient and faxed by secretary.

## 2013-07-22 ENCOUNTER — Ambulatory Visit: Payer: Self-pay | Admitting: Podiatry

## 2013-07-23 ENCOUNTER — Encounter: Payer: Self-pay | Admitting: Podiatry

## 2013-07-23 ENCOUNTER — Ambulatory Visit (INDEPENDENT_AMBULATORY_CARE_PROVIDER_SITE_OTHER): Payer: BC Managed Care – PPO | Admitting: Podiatry

## 2013-07-23 VITALS — BP 137/83 | HR 69 | Ht 62.0 in | Wt 180.0 lb

## 2013-07-23 DIAGNOSIS — M2010 Hallux valgus (acquired), unspecified foot: Secondary | ICD-10-CM

## 2013-07-23 DIAGNOSIS — G609 Hereditary and idiopathic neuropathy, unspecified: Secondary | ICD-10-CM | POA: Insufficient documentation

## 2013-07-23 DIAGNOSIS — M21619 Bunion of unspecified foot: Secondary | ICD-10-CM

## 2013-07-23 NOTE — Patient Instructions (Addendum)
Seen for diabetic foot care and numbness and tingling of feet. Diagnosed as Bilateral bunion and abnormal weight shifting to balls of feet. May benefit from Orthotic shoe inserts.

## 2013-07-23 NOTE — Progress Notes (Signed)
Subjective: 67 y.o. year old female patient presents complaining of burning and tingling sensations on feet and legs for duration of 3-4 months. Was diagnosed for borderline diabetes a couple of weeks ago. She believes her Blood sugar is under control.   Patient Summary List & History reviewed for allergies, medications, medical problems and surgical history.  Review of Systems - General ROS: positive for  - hot flashes, night sweats, sleep disturbance, weight gain and sleeps about 4-5 hours at night. Ophthalmic ROS: negative ENT ROS: Occasional headaches and pain in ear from pinch nerve on shoulder and neck. Allergy and Immunology ROS: having problem with smell and pollen due to pinch nerve.  Breast ROS: negative for breast lumps Respiratory ROS: no cough, shortness of breath, or wheezing Cardiovascular ROS: no chest pain or dyspnea on exertion Gastrointestinal ROS: Hiatal hernia and acid reflux. Genito-Urinary ROS: no dysuria, trouble voiding, or hematuria Musculoskeletal ROS: Arthritic pain on right knee and left shoulder. Neurological ROS: weak and have low tolerance to stress. Dermatological ROS: negative  Objective: Dermatologic:  mobility and turgor normal, nails clear without discoloration or sign of infection, no abnormal pigmentation, no eczematous changes, no edema, no evidence of bleeding or bruising, temperature normal and vascularity normal Skin: There were no significant lesions on complete cutaneous exam of head, neck,anterior and posterior trunk, bilateral upper and lower extremities Nails are hypertrophic, mild 1-5 bilateral. Vascular: Both feet have warm, good capillary refill, no trophic changes or ulcerative lesions and normal DP and PT pulses  Orthopedic:  Bilateral bunion deformity with forefoot varus.  Neurologic:  All epicritic and tactile sensations grossly intact. DTR and vibratory sensations normal on both feet. Normal response to Monofilament sensory  testing bilateral.  Assessment: Bilateral bunion with forefoot varus. Paresthesia bilateral plantar ball possible due to abnormal biomechanics (weight shifting to balls of feet due to bunion and dysfunctional first ray)  Plan: Orthotics to equalize weight bearing surface. Will call patient after benefit determination.

## 2013-08-02 ENCOUNTER — Ambulatory Visit: Payer: BC Managed Care – PPO | Admitting: Podiatry

## 2013-08-09 ENCOUNTER — Ambulatory Visit (INDEPENDENT_AMBULATORY_CARE_PROVIDER_SITE_OTHER): Payer: BC Managed Care – PPO | Admitting: Podiatry

## 2013-08-09 ENCOUNTER — Encounter: Payer: Self-pay | Admitting: Podiatry

## 2013-08-09 VITALS — BP 118/77 | HR 70 | Ht 62.0 in | Wt 180.0 lb

## 2013-08-09 DIAGNOSIS — M21619 Bunion of unspecified foot: Secondary | ICD-10-CM

## 2013-08-09 DIAGNOSIS — M2010 Hallux valgus (acquired), unspecified foot: Secondary | ICD-10-CM

## 2013-08-09 DIAGNOSIS — G609 Hereditary and idiopathic neuropathy, unspecified: Secondary | ICD-10-CM

## 2013-08-09 NOTE — Progress Notes (Signed)
Still having experiencing burning and stinging feet. Her Cholesterol medication is making her muscles weak.   Assessment: Bilateral HAV with bunion. Paresthesia both feet plantar.   Plan: Both feet were casted for orthotics. Will try orthotics first before starting on oral medication.

## 2013-08-09 NOTE — Patient Instructions (Signed)
Both feet were casted for Orthotics. Will try Orthotics first before starting on oral medication. Will call patient when orthotics are ready.

## 2013-09-30 ENCOUNTER — Ambulatory Visit: Payer: BC Managed Care – PPO | Admitting: Podiatry

## 2013-10-14 ENCOUNTER — Institutional Professional Consult (permissible substitution): Payer: BC Managed Care – PPO | Admitting: Pulmonary Disease

## 2013-10-18 ENCOUNTER — Emergency Department (HOSPITAL_BASED_OUTPATIENT_CLINIC_OR_DEPARTMENT_OTHER)
Admission: EM | Admit: 2013-10-18 | Discharge: 2013-10-18 | Disposition: A | Discharge status: Home / Self Care | Payer: Medicare Other | Attending: Emergency Medicine | Admitting: Emergency Medicine

## 2013-10-18 ENCOUNTER — Encounter (HOSPITAL_BASED_OUTPATIENT_CLINIC_OR_DEPARTMENT_OTHER): Payer: Self-pay | Admitting: Emergency Medicine

## 2013-10-18 ENCOUNTER — Emergency Department (HOSPITAL_BASED_OUTPATIENT_CLINIC_OR_DEPARTMENT_OTHER): Payer: Medicare Other

## 2013-10-18 DIAGNOSIS — Z87891 Personal history of nicotine dependence: Secondary | ICD-10-CM | POA: Diagnosis not present

## 2013-10-18 DIAGNOSIS — J45909 Unspecified asthma, uncomplicated: Secondary | ICD-10-CM | POA: Insufficient documentation

## 2013-10-18 DIAGNOSIS — E785 Hyperlipidemia, unspecified: Secondary | ICD-10-CM | POA: Diagnosis not present

## 2013-10-18 DIAGNOSIS — Z7982 Long term (current) use of aspirin: Secondary | ICD-10-CM | POA: Insufficient documentation

## 2013-10-18 DIAGNOSIS — I10 Essential (primary) hypertension: Secondary | ICD-10-CM | POA: Insufficient documentation

## 2013-10-18 DIAGNOSIS — J3489 Other specified disorders of nose and nasal sinuses: Secondary | ICD-10-CM

## 2013-10-18 DIAGNOSIS — Z8669 Personal history of other diseases of the nervous system and sense organs: Secondary | ICD-10-CM | POA: Diagnosis not present

## 2013-10-18 DIAGNOSIS — R05 Cough: Secondary | ICD-10-CM | POA: Diagnosis not present

## 2013-10-18 DIAGNOSIS — Z79899 Other long term (current) drug therapy: Secondary | ICD-10-CM | POA: Insufficient documentation

## 2013-10-18 DIAGNOSIS — Z880 Allergy status to penicillin: Secondary | ICD-10-CM | POA: Insufficient documentation

## 2013-10-18 DIAGNOSIS — K219 Gastro-esophageal reflux disease without esophagitis: Secondary | ICD-10-CM | POA: Insufficient documentation

## 2013-10-18 DIAGNOSIS — R059 Cough, unspecified: Secondary | ICD-10-CM | POA: Diagnosis not present

## 2013-10-18 DIAGNOSIS — R51 Headache: Secondary | ICD-10-CM | POA: Insufficient documentation

## 2013-10-18 DIAGNOSIS — IMO0002 Reserved for concepts with insufficient information to code with codable children: Secondary | ICD-10-CM | POA: Diagnosis not present

## 2013-10-18 HISTORY — DX: Unspecified asthma, uncomplicated: J45.909

## 2013-10-18 HISTORY — DX: Prediabetes: R73.03

## 2013-10-18 HISTORY — DX: Diaphragmatic hernia without obstruction or gangrene: K44.9

## 2013-10-18 HISTORY — DX: Reserved for inherently not codable concepts without codable children: IMO0001

## 2013-10-18 HISTORY — DX: Gastro-esophageal reflux disease without esophagitis: K21.9

## 2013-10-18 MED ORDER — AZITHROMYCIN 250 MG PO TABS: 250.0000 mg | ORAL_TABLET | Freq: Every day | ORAL | Status: DC

## 2013-10-18 NOTE — ED Notes (Signed)
Patient states she has a one week history of headache and sinus pressure which is causing pain and numbness in her right neck and arm.  States she has felt weak all week and continues to have a pulling pain in the top of her head with sinus pressure.  C/O weakness on the right arm and leg, which is a chronic condition, but this morning she felt weaker than normal and felt faint.  States she was seen yesterday at [facility] and told she has polyps in her nose for which she was instructed to use saline spray. Also, has a recent hx of coughing and was seen and treated by her allergy physician and started on Qvar, which helped to control the cough.  The pt developed a ruptured blood vessel in the left eye from coughing so much.

## 2013-10-18 NOTE — ED Provider Notes (Signed)
CSN: [id number]     Arrival date & time 10/18/13  1250 History   First MD Initiated Contact with Patient 10/18/13 1355     Chief Complaint  Patient presents with  . Headache   (Consider location/radiation/quality/duration/timing/severity/associated sxs/prior Treatment) Patient is a 67 y.o. female presenting with headaches. The history is provided by the patient. No language interpreter was used.  Headache Pain location:  Generalized Quality:  Sharp Radiates to:  Face Severity currently:  6/10 Severity at highest:  6/10 Onset quality:  Gradual Timing:  Intermittent Progression:  Worsening Relieved by:  Nothing Worsened by:  Nothing tried Ineffective treatments:  None tried Associated symptoms: congestion, cough and facial pain     Past Medical History  Diagnosis Date  . Hypertension   . Vertigo   . Hyperlipidemia   . Pinched nerve in neck     neck and shoulder  . Asthma   . Reflux   . Hiatal hernia   . Pre-diabetes    Past Surgical History  Procedure Laterality Date  . Abdominal hysterectomy     No family history on file. History  Substance Use Topics  . Smoking status: Former Smoker -- 0.50 packs/day    Quit date: 12/21/2012  . Smokeless tobacco: Never Used  . Alcohol Use: No   OB History   Grav Para Term Preterm Abortions TAB SAB Ect Mult Living                 Review of Systems  HENT: Positive for congestion.   Respiratory: Positive for cough.   Skin: Positive for color change.  Neurological: Positive for headaches.  All other systems reviewed and are negative.    Allergies  Codeine; Iodine; Penicillins; Prednisone; and Sulfa antibiotics  Home Medications   Current Outpatient Rx  Name  Route  Sig  Dispense  Refill  . albuterol (PROVENTIL) (2.5 MG/3ML) 0.083% nebulizer solution   Nebulization   Take 2.5 mg by nebulization every 6 (six) hours as needed for wheezing or shortness of breath.         Marland Kitchen aspirin 81 MG tablet   Oral   Take 81 mg  by mouth daily.         . beclomethasone (QVAR) 40 MCG/ACT inhaler   Inhalation   Inhale 2 puffs into the lungs 2 (two) times daily.         . cholecalciferol (VITAMIN D) 1000 UNITS tablet   Oral   Take 1,000 Units by mouth daily.         Marland Kitchen esomeprazole (NEXIUM) 40 MG capsule   Oral   Take 40 mg by mouth daily before breakfast.         . lisinopril (PRINIVIL,ZESTRIL) 10 MG tablet   Oral   Take 10 mg by mouth daily.         . meclizine (ANTIVERT) 25 MG tablet   Oral   Take 25 mg by mouth 3 (three) times daily as needed.         . metFORMIN (GLUCOPHAGE) 500 MG tablet   Oral   Take 500 mg by mouth. Half tab with meal at evening         . montelukast (SINGULAIR) 10 MG tablet   Oral   Take 10 mg by mouth as needed.         . pravastatin (PRAVACHOL) 20 MG tablet   Oral   Take 20 mg by mouth daily.         Marland Kitchen  ALPRAZolam (XANAX) 0.25 MG tablet   Oral   Take 1 tablet (0.25 mg total) by mouth 3 (three) times daily as needed for anxiety.   20 tablet   0    BP 161/80  Temp(Src) 98.1 F (36.7 C) (Oral)  Resp 20  SpO2 100% Physical Exam  Constitutional: She appears well-developed and well-nourished.  HENT:  Head: Normocephalic.  Right Ear: External ear normal.  Left Ear: External ear normal.  Eyes: Conjunctivae and EOM are normal. Pupils are equal, round, and reactive to light.  Neck: Normal range of motion. Neck supple.  Cardiovascular: Normal rate and normal heart sounds.   Pulmonary/Chest: Effort normal and breath sounds normal.  Abdominal: Soft. Bowel sounds are normal.  Musculoskeletal: Normal range of motion.  Neurological: She is alert.  Skin: Skin is warm.  Psychiatric: She has a normal mood and affect.    ED Course  Procedures (including critical care time) Labs Review Labs Reviewed - No data to display Imaging Review Ct Head Wo Contrast  10/18/2013   CLINICAL DATA:  Headache and sinus pressure with pain and numbness in right neck and  arm.  EXAM: CT HEAD WITHOUT CONTRAST  CT MAXILLOFACIAL WITHOUT CONTRAST  TECHNIQUE: Multidetector CT imaging of the head and maxillofacial structures were performed using the standard protocol without intravenous contrast. Multiplanar CT image reconstructions of the maxillofacial structures were also generated.  COMPARISON:  Head CT 01/17/2013.  FINDINGS: CT HEAD FINDINGS  Ventricles and sulci are within normal limits for age. Mild periventricular hypodensity, most prominently in the right frontal white matter, is unchanged and compatible with mild chronic small vessel ischemic disease. There is no evidence of acute cortical infarct, mass, midline shift, intracranial hemorrhage, or extra-axial fluid collection. Orbits are unremarkable. Small left sphenoid sinus mucous retention cyst is unchanged. Partial opacification of a right-sided ethmoid air cell is unchanged. Mastoid air cells are clear. No fracture is identified.  CT MAXILLOFACIAL FINDINGS  Orbits are unremarkable. Calcification is noted in the left parotid gland. Submandibular glands are unremarkable. Frontal sinuses are clear. Frontal recesses are patent. There is partial opacification of a right ethmoid air cell, unchanged. Left ethmoid air cells are clear. Mucous retention cyst in the left sphenoid sinus is unchanged. Trace mucosal thickening is noted in the alveolar recess of the left maxillary sinus. Right maxillary sinus is clear. Ostiomeatal complexes are patent. Sphenoethmoidal recesses are patent. No air-fluid levels are seen. There is minimal right nasal septal deviation. No maxillofacial fracture is identified. There is grade 1 anterolisthesis of C4 on C5, likely facet-mediated.  IMPRESSION: 1. No evidence of acute intracranial abnormality. 2. Minimal paranasal sinus mucosal thickening.  No air-fluid levels.   Electronically Signed   By: Sebastian Ache   On: 10/18/2013 14:52   Ct Maxillofacial Wo Cm  10/18/2013   CLINICAL DATA:  Headache and  sinus pressure with pain and numbness in right neck and arm.  EXAM: CT HEAD WITHOUT CONTRAST  CT MAXILLOFACIAL WITHOUT CONTRAST  TECHNIQUE: Multidetector CT imaging of the head and maxillofacial structures were performed using the standard protocol without intravenous contrast. Multiplanar CT image reconstructions of the maxillofacial structures were also generated.  COMPARISON:  Head CT 01/17/2013.  FINDINGS: CT HEAD FINDINGS  Ventricles and sulci are within normal limits for age. Mild periventricular hypodensity, most prominently in the right frontal white matter, is unchanged and compatible with mild chronic small vessel ischemic disease. There is no evidence of acute cortical infarct, mass, midline shift, intracranial hemorrhage, or extra-axial  fluid collection. Orbits are unremarkable. Small left sphenoid sinus mucous retention cyst is unchanged. Partial opacification of a right-sided ethmoid air cell is unchanged. Mastoid air cells are clear. No fracture is identified.  CT MAXILLOFACIAL FINDINGS  Orbits are unremarkable. Calcification is noted in the left parotid gland. Submandibular glands are unremarkable. Frontal sinuses are clear. Frontal recesses are patent. There is partial opacification of a right ethmoid air cell, unchanged. Left ethmoid air cells are clear. Mucous retention cyst in the left sphenoid sinus is unchanged. Trace mucosal thickening is noted in the alveolar recess of the left maxillary sinus. Right maxillary sinus is clear. Ostiomeatal complexes are patent. Sphenoethmoidal recesses are patent. No air-fluid levels are seen. There is minimal right nasal septal deviation. No maxillofacial fracture is identified. There is grade 1 anterolisthesis of C4 on C5, likely facet-mediated.  IMPRESSION: 1. No evidence of acute intracranial abnormality. 2. Minimal paranasal sinus mucosal thickening.  No air-fluid levels.   Electronically Signed   By: Sebastian Ache   On: 10/18/2013 14:52    EKG  Interpretation    Date/Time:  Friday October 18 2013 13:21:56 EST Ventricular Rate:  65 PR Interval:  154 QRS Duration: 92 QT Interval:  396 QTC Calculation: 411 R Axis:     Text Interpretation:  Normal sinus rhythm Septal infarct , age undetermined Abnormal ECG No significant change since last tracing Confirmed by POLLINA  MD, CHRISTOPHER ([id number]) on 10/18/2013 1:51:34 PM          Pt has multiple chronic symtpoms.   I will treat with zithromax to see if this help with sinus symptoms.   Pt given number for ent to see and evaluate.  MDM   1. Sinus pain     Elson Areas, [place] 10/18/13 1534

## 2013-10-20 NOTE — ED Provider Notes (Signed)
Medical screening examination/treatment/procedure(s) were performed by non-physician practitioner and as supervising physician I was immediately available for consultation/collaboration.  EKG Interpretation    Date/Time:  Friday October 18 2013 14:54:53 EST Ventricular Rate:  72 PR Interval:  168 QRS Duration: 96 QT Interval:  380 QTC Calculation: 416 R Axis:   -11 Text Interpretation:  Normal sinus rhythm Normal ECG No significant change since last tracing Confirmed by Ethelda Chick  MD, SAM ([id number]) on 10/18/2013 3:00:19 PM               Gilda Crease, MD 10/20/13 1056

## 2013-10-30 ENCOUNTER — Emergency Department (HOSPITAL_BASED_OUTPATIENT_CLINIC_OR_DEPARTMENT_OTHER)
Admission: EM | Admit: 2013-10-30 | Discharge: 2013-10-30 | Disposition: A | Discharge status: Home / Self Care | Payer: Medicare Other | Attending: Emergency Medicine | Admitting: Emergency Medicine

## 2013-10-30 ENCOUNTER — Encounter (HOSPITAL_BASED_OUTPATIENT_CLINIC_OR_DEPARTMENT_OTHER): Payer: Self-pay | Admitting: Emergency Medicine

## 2013-10-30 ENCOUNTER — Emergency Department (HOSPITAL_BASED_OUTPATIENT_CLINIC_OR_DEPARTMENT_OTHER): Payer: Medicare Other

## 2013-10-30 DIAGNOSIS — E785 Hyperlipidemia, unspecified: Secondary | ICD-10-CM | POA: Insufficient documentation

## 2013-10-30 DIAGNOSIS — K219 Gastro-esophageal reflux disease without esophagitis: Secondary | ICD-10-CM | POA: Insufficient documentation

## 2013-10-30 DIAGNOSIS — Z87891 Personal history of nicotine dependence: Secondary | ICD-10-CM | POA: Insufficient documentation

## 2013-10-30 DIAGNOSIS — I10 Essential (primary) hypertension: Secondary | ICD-10-CM | POA: Insufficient documentation

## 2013-10-30 DIAGNOSIS — Z79899 Other long term (current) drug therapy: Secondary | ICD-10-CM | POA: Insufficient documentation

## 2013-10-30 DIAGNOSIS — M79604 Pain in right leg: Secondary | ICD-10-CM

## 2013-10-30 DIAGNOSIS — J45909 Unspecified asthma, uncomplicated: Secondary | ICD-10-CM | POA: Insufficient documentation

## 2013-10-30 DIAGNOSIS — M79609 Pain in unspecified limb: Secondary | ICD-10-CM | POA: Insufficient documentation

## 2013-10-30 DIAGNOSIS — Z7982 Long term (current) use of aspirin: Secondary | ICD-10-CM | POA: Insufficient documentation

## 2013-10-30 DIAGNOSIS — Z880 Allergy status to penicillin: Secondary | ICD-10-CM | POA: Insufficient documentation

## 2013-10-30 DIAGNOSIS — G5680 Other specified mononeuropathies of unspecified upper limb: Secondary | ICD-10-CM | POA: Insufficient documentation

## 2013-10-30 DIAGNOSIS — IMO0002 Reserved for concepts with insufficient information to code with codable children: Secondary | ICD-10-CM | POA: Insufficient documentation

## 2013-10-30 MED ORDER — HYDROCODONE-ACETAMINOPHEN 5-325 MG PO TABS: 2.0000 | ORAL_TABLET | ORAL | Status: DC | PRN

## 2013-10-30 NOTE — ED Notes (Signed)
Pt c/o right hip and right calf pain x 3 days denies injury

## 2013-10-30 NOTE — ED Provider Notes (Signed)
History/physical exam/procedure(s) were performed by non-physician practitioner and as supervising physician I was immediately available for consultation/collaboration. I have reviewed all notes and am in agreement with care and plan.   Hilario Quarry, MD 10/30/13 [phone]

## 2013-10-30 NOTE — ED Provider Notes (Signed)
CSN: [id number]     Arrival date & time 10/30/13  1543 History   First MD Initiated Contact with Patient 10/30/13 1724     Chief Complaint  Patient presents with  . Leg Pain   (Consider location/radiation/quality/duration/timing/severity/associated sxs/prior Treatment) Patient is a 67 y.o. female presenting with leg pain. The history is provided by the patient. No language interpreter was used.  Leg Pain Location:  Leg Time since incident:  3 days Injury: no   Leg location:  R leg Pain details:    Quality:  Aching and cramping   Radiates to:  Does not radiate   Severity:  Moderate   Onset quality:  Gradual   Duration:  3 days   Timing:  Constant   Progression:  Worsening Chronicity:  New Dislocation: no   Foreign body present:  No foreign bodies Tetanus status:  Unknown Relieved by:  Nothing Worsened by:  Nothing tried Ineffective treatments:  None tried Associated symptoms: back pain   Risk factors: no concern for non-accidental trauma    Pt is worried that she has a blood clot.  Pt reports she had a clot in right leg years ago Past Medical History  Diagnosis Date  . Hypertension   . Vertigo   . Hyperlipidemia   . Pinched nerve in neck     neck and shoulder  . Asthma   . Reflux   . Hiatal hernia   . Pre-diabetes    Past Surgical History  Procedure Laterality Date  . Abdominal hysterectomy     History reviewed. No pertinent family history. History  Substance Use Topics  . Smoking status: Former Smoker -- 0.50 packs/day    Quit date: 12/21/2012  . Smokeless tobacco: Never Used  . Alcohol Use: No   OB History   Grav Para Term Preterm Abortions TAB SAB Ect Mult Living                 Review of Systems  Musculoskeletal: Positive for back pain.  All other systems reviewed and are negative.    Allergies  Codeine; Iodine; Penicillins; Prednisone; and Sulfa antibiotics  Home Medications   Current Outpatient Rx  Name  Route  Sig  Dispense  Refill  .  albuterol (PROVENTIL) (2.5 MG/3ML) 0.083% nebulizer solution   Nebulization   Take 2.5 mg by nebulization every 6 (six) hours as needed for wheezing or shortness of breath.         . ALPRAZolam (XANAX) 0.25 MG tablet   Oral   Take 1 tablet (0.25 mg total) by mouth 3 (three) times daily as needed for anxiety.   20 tablet   0   . aspirin 81 MG tablet   Oral   Take 81 mg by mouth daily.         Marland Kitchen azithromycin (ZITHROMAX) 250 MG tablet   Oral   Take 1 tablet (250 mg total) by mouth daily. Take first 2 tablets together, then 1 every day until finished.   6 tablet   0   . beclomethasone (QVAR) 40 MCG/ACT inhaler   Inhalation   Inhale 2 puffs into the lungs 2 (two) times daily.         . cholecalciferol (VITAMIN D) 1000 UNITS tablet   Oral   Take 1,000 Units by mouth daily.         Marland Kitchen esomeprazole (NEXIUM) 40 MG capsule   Oral   Take 40 mg by mouth daily before breakfast.         .  lisinopril (PRINIVIL,ZESTRIL) 10 MG tablet   Oral   Take 10 mg by mouth daily.         . meclizine (ANTIVERT) 25 MG tablet   Oral   Take 25 mg by mouth 3 (three) times daily as needed.         . metFORMIN (GLUCOPHAGE) 500 MG tablet   Oral   Take 500 mg by mouth. Half tab with meal at evening         . montelukast (SINGULAIR) 10 MG tablet   Oral   Take 10 mg by mouth as needed.         . pravastatin (PRAVACHOL) 20 MG tablet   Oral   Take 20 mg by mouth daily.          BP 135/67  Pulse 73  Temp(Src) 98.5 F (36.9 C) (Oral)  Resp 16  Wt 180 lb (81.647 kg)  SpO2 99% Physical Exam  Nursing note and vitals reviewed. Constitutional: She is oriented to person, place, and time. She appears well-developed and well-nourished.  HENT:  Head: Normocephalic and atraumatic.  Eyes: Pupils are equal, round, and reactive to light.  Neck: Normal range of motion. Neck supple.  Cardiovascular: Normal rate.   Pulmonary/Chest: Effort normal.  Abdominal: Soft.  Neurological: She is  alert and oriented to person, place, and time.  Skin: Skin is warm.  Psychiatric: She has a normal mood and affect.    ED Course  Procedures (including critical care time) Labs Review Labs Reviewed - No data to display Imaging Review US Venous Img Lower Unilateral Right  10/30/2013   CLINICAL DATA:  Right leg pain for 3 days.  EXAM: Right LOWER EXTREMITY VENOUS DOPPLER ULTRASOUND  TECHNIQUE: Gray-scale sonography with graded compression, as well as color Doppler and duplex ultrasound, were performed to evaluate the deep venous system from the level of the common femoral vein through the popliteal and proximal calf veins. Spectral Doppler was utilized to evaluate flow at rest and with distal augmentation maneuvers.  COMPARISON:  None.  FINDINGS: Thrombus within deep veins:  None visualized.  Compressibility of deep veins:  Normal.  Duplex waveform respiratory phasicity:  Normal.  Duplex waveform response to augmentation:  Normal.  Venous reflux:  None visualized.  Other findings:  None visualized.  IMPRESSION: Normal sonographic evaluation of the venous system of the right lower extremity without evidence of thrombosis.   Electronically Signed   By: Elberta Fortis M.D.   On: 10/30/2013 18:20    EKG Interpretation   None       MDM   1. Leg pain, right    Ultrasound is normal.   Pt reports she is still having sinus problems since I saw her last.   I advised see ENt as advised.   Pt given hydrocodone for leg pain.   I advised her to see her MD for recheck  Elson Areas, PA-C 10/30/13 2010

## 2013-11-27 ENCOUNTER — Ambulatory Visit (INDEPENDENT_AMBULATORY_CARE_PROVIDER_SITE_OTHER): Payer: BLUE CROSS/BLUE SHIELD | Admitting: Podiatry

## 2013-11-27 ENCOUNTER — Encounter: Payer: Self-pay | Admitting: Podiatry

## 2013-11-27 VITALS — BP 158/80 | HR 72 | Ht 60.0 in | Wt 182.0 lb

## 2013-11-27 DIAGNOSIS — E1149 Type 2 diabetes mellitus with other diabetic neurological complication: Secondary | ICD-10-CM

## 2013-11-27 DIAGNOSIS — M79609 Pain in unspecified limb: Secondary | ICD-10-CM

## 2013-11-27 DIAGNOSIS — M2040 Other hammer toe(s) (acquired), unspecified foot: Secondary | ICD-10-CM

## 2013-11-27 DIAGNOSIS — M21619 Bunion of unspecified foot: Secondary | ICD-10-CM

## 2013-11-27 NOTE — Patient Instructions (Signed)
Orthotics are doing well.  Both feet measured for diabetic shoes.

## 2013-11-27 NOTE — Progress Notes (Signed)
Subjective: 68 year old female presents for follow up on Orthotics. Patient feels that the orthotics are helping. Still wants to have diabetic shoes. Bunion has not been bothering like it has before since wearing Orthotics.  Objective: Dermatologic:  Nails are hypertrophic 1-5 bilateral.  Vascular:  Both feet have warm, good capillary refill, no trophic changes or ulcerative lesions and normal DP and PT pulses  Orthopedic:  Bilateral bunion deformity with forefoot varus.  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 Neurologic:  All epicritic and tactile sensations grossly intact.  DTR and vibratory sensations normal on both feet.  Normal response to Monofilament sensory testing bilateral.  Radiographic examination reveal severe hallux valgus with bunion R>L. Fibular sesamoid position is 6 on right, 4 on left. Increased lateral deviation of the calcaneocuboid joint angle bilateral R>L.  Severely elevated first ray bilateral R>L.  Positive of posterior calcaneal spur right foot. Positive of accessory bone (0.5x1cm) at the base of the 5th Metatarsal bone bilateral.  Assessment:  Bilateral bunion with forefoot varus.  Paresthesia bilateral plantar ball possible due to abnormal biomechanics. Abnormal weight shifting to balls of feet due to bunion and dysfunctional first ray.    Plan: Orthotics has helped some.  Now wishes to have diabetic shoes.  Both feet measured for diabetic shoes.

## 2014-04-01 ENCOUNTER — Emergency Department (HOSPITAL_BASED_OUTPATIENT_CLINIC_OR_DEPARTMENT_OTHER): Payer: Medicare Other

## 2014-04-01 ENCOUNTER — Encounter (HOSPITAL_BASED_OUTPATIENT_CLINIC_OR_DEPARTMENT_OTHER): Payer: Self-pay | Admitting: Emergency Medicine

## 2014-04-01 ENCOUNTER — Emergency Department (HOSPITAL_BASED_OUTPATIENT_CLINIC_OR_DEPARTMENT_OTHER)
Admission: EM | Admit: 2014-04-01 | Discharge: 2014-04-01 | Disposition: A | Discharge status: Home / Self Care | Payer: Medicare Other | Attending: Emergency Medicine | Admitting: Emergency Medicine

## 2014-04-01 DIAGNOSIS — Z7982 Long term (current) use of aspirin: Secondary | ICD-10-CM | POA: Insufficient documentation

## 2014-04-01 DIAGNOSIS — J45909 Unspecified asthma, uncomplicated: Secondary | ICD-10-CM | POA: Insufficient documentation

## 2014-04-01 DIAGNOSIS — R5381 Other malaise: Secondary | ICD-10-CM | POA: Insufficient documentation

## 2014-04-01 DIAGNOSIS — R51 Headache: Secondary | ICD-10-CM | POA: Insufficient documentation

## 2014-04-01 DIAGNOSIS — J40 Bronchitis, not specified as acute or chronic: Secondary | ICD-10-CM

## 2014-04-01 DIAGNOSIS — Z8669 Personal history of other diseases of the nervous system and sense organs: Secondary | ICD-10-CM | POA: Insufficient documentation

## 2014-04-01 DIAGNOSIS — E785 Hyperlipidemia, unspecified: Secondary | ICD-10-CM | POA: Insufficient documentation

## 2014-04-01 DIAGNOSIS — I10 Essential (primary) hypertension: Secondary | ICD-10-CM | POA: Insufficient documentation

## 2014-04-01 DIAGNOSIS — Z792 Long term (current) use of antibiotics: Secondary | ICD-10-CM | POA: Insufficient documentation

## 2014-04-01 DIAGNOSIS — K219 Gastro-esophageal reflux disease without esophagitis: Secondary | ICD-10-CM | POA: Insufficient documentation

## 2014-04-01 DIAGNOSIS — R638 Other symptoms and signs concerning food and fluid intake: Secondary | ICD-10-CM | POA: Insufficient documentation

## 2014-04-01 DIAGNOSIS — R5383 Other fatigue: Secondary | ICD-10-CM

## 2014-04-01 DIAGNOSIS — R1110 Vomiting, unspecified: Secondary | ICD-10-CM | POA: Insufficient documentation

## 2014-04-01 DIAGNOSIS — Z87891 Personal history of nicotine dependence: Secondary | ICD-10-CM | POA: Insufficient documentation

## 2014-04-01 DIAGNOSIS — Z79899 Other long term (current) drug therapy: Secondary | ICD-10-CM | POA: Insufficient documentation

## 2014-04-01 DIAGNOSIS — J029 Acute pharyngitis, unspecified: Secondary | ICD-10-CM | POA: Insufficient documentation

## 2014-04-01 DIAGNOSIS — Z880 Allergy status to penicillin: Secondary | ICD-10-CM | POA: Insufficient documentation

## 2014-04-01 LAB — RAPID STREP SCREEN (MED CTR MEBANE ONLY): Streptococcus, Group A Screen (Direct): NEGATIVE

## 2014-04-01 MED ORDER — ONDANSETRON 4 MG PO TBDP: ORAL_TABLET | ORAL | Status: DC

## 2014-04-01 MED ORDER — ACETAMINOPHEN 500 MG PO TABS
1000.0000 mg | ORAL_TABLET | Freq: Once | ORAL | Status: AC
  Administered 2014-04-01: 1000 mg via ORAL
  Filled 2014-04-01: qty 2

## 2014-04-01 MED ORDER — ONDANSETRON 4 MG PO TBDP
4.0000 mg | ORAL_TABLET | Freq: Once | ORAL | Status: AC
  Administered 2014-04-01: 4 mg via ORAL
  Filled 2014-04-01: qty 1

## 2014-04-01 MED ORDER — DOXYCYCLINE HYCLATE 100 MG PO CAPS: 100.0000 mg | ORAL_CAPSULE | Freq: Two times a day (BID) | ORAL | Status: DC

## 2014-04-01 NOTE — ED Notes (Addendum)
C/o prod cough, pain to head, ears and neck x 3-4 days

## 2014-04-01 NOTE — ED Notes (Signed)
Pt directed to pharmacy to pick up medications- pt has family to drive at bedside

## 2014-04-01 NOTE — ED Provider Notes (Signed)
CSN: [id number]     Arrival date & time 04/01/14  1526 History   First MD Initiated Contact with Patient 04/01/14 1548     Chief Complaint  Patient presents with  . Cough     (Consider location/radiation/quality/duration/timing/severity/associated sxs/prior Treatment) HPI Comments: 68 year old female with high blood pressure and neuropathy history presents with sore throat, productive cough, mild bodyaches and sinus headache for the past 3-4 days. Patient did have a family member with strep throat fairly recently.  No recent travel. Symptoms fairly constant nothing specifically improves. Low-grade fever 99 per patient. no immunosuppression history.   Patient is a 68 y.o. female presenting with cough. The history is provided by the patient.  Cough Associated symptoms: headaches   Associated symptoms: no chest pain, no chills, no fever, no rash and no shortness of breath     Past Medical History  Diagnosis Date  . Hypertension   . Vertigo   . Hyperlipidemia   . Pinched nerve in neck     neck and shoulder  . Asthma   . Reflux   . Hiatal hernia   . Pre-diabetes    Past Surgical History  Procedure Laterality Date  . Abdominal hysterectomy     No family history on file. History  Substance Use Topics  . Smoking status: Former Smoker -- 0.50 packs/day    Quit date: 12/21/2012  . Smokeless tobacco: Never Used  . Alcohol Use: No   OB History   Grav Para Term Preterm Abortions TAB SAB Ect Mult Living                 Review of Systems  Constitutional: Positive for appetite change and fatigue. Negative for fever and chills.  HENT: Positive for congestion.   Eyes: Negative for visual disturbance.  Respiratory: Positive for cough. Negative for shortness of breath.   Cardiovascular: Negative for chest pain.  Gastrointestinal: Positive for vomiting. Negative for abdominal pain and diarrhea (nonbloody).  Genitourinary: Negative for dysuria and flank pain.  Musculoskeletal:  Positive for arthralgias. Negative for back pain, neck pain and neck stiffness.  Skin: Negative for rash.  Neurological: Positive for headaches. Negative for light-headedness.      Allergies  Codeine; Iodine; Penicillins; Prednisone; and Sulfa antibiotics  Home Medications   Prior to Admission medications   Medication Sig Start Date End Date Taking? Authorizing Provider  albuterol (PROVENTIL) (2.5 MG/3ML) 0.083% nebulizer solution Take 2.5 mg by nebulization every 6 (six) hours as needed for wheezing or shortness of breath.    Historical Provider, MD  ALPRAZolam Prudy Feeler) 0.25 MG tablet Take 1 tablet (0.25 mg total) by mouth 3 (three) times daily as needed for anxiety. 03/08/13   Carlisle Beers Molpus, MD  aspirin 81 MG tablet Take 81 mg by mouth daily.    Historical Provider, MD  azithromycin (ZITHROMAX) 250 MG tablet Take 1 tablet (250 mg total) by mouth daily. Take first 2 tablets together, then 1 every day until finished. 10/18/13   Elson Areas, PA-C  beclomethasone (QVAR) 40 MCG/ACT inhaler Inhale 2 puffs into the lungs 2 (two) times daily.    Historical Provider, MD  cholecalciferol (VITAMIN D) 1000 UNITS tablet Take 1,000 Units by mouth daily.    Historical Provider, MD  esomeprazole (NEXIUM) 40 MG capsule Take 40 mg by mouth daily before breakfast.    Historical Provider, MD  HYDROcodone-acetaminophen (NORCO/VICODIN) 5-325 MG per tablet Take 2 tablets by mouth every 4 (four) hours as needed. 10/30/13   Lonia Skinner  Sofia, PA-C  lisinopril (PRINIVIL,ZESTRIL) 10 MG tablet Take 10 mg by mouth daily.    Historical Provider, MD  meclizine (ANTIVERT) 25 MG tablet Take 25 mg by mouth 3 (three) times daily as needed.    Historical Provider, MD  metFORMIN (GLUCOPHAGE) 500 MG tablet Take 500 mg by mouth. Half tab with meal at evening    Historical Provider, MD  montelukast (SINGULAIR) 10 MG tablet Take 10 mg by mouth as needed.    Historical Provider, MD  NASONEX 50 MCG/ACT nasal spray  09/30/13    Historical Provider, MD  pravastatin (PRAVACHOL) 20 MG tablet Take 20 mg by mouth daily.    Historical Provider, MD   BP 155/73  Pulse 83  Temp(Src) 99 F (37.2 C) (Oral)  Resp 20  Ht 5\' 2"  (1.575 m)  Wt 180 lb (81.647 kg)  BMI 32.91 kg/m2  SpO2 97% Physical Exam  Nursing note and vitals reviewed. Constitutional: She is oriented to person, place, and time. She appears well-developed and well-nourished.  HENT:  Head: Normocephalic and atraumatic.  Mild diabetes membranes No trismus, uvular deviation, unilateral posterior pharyngeal edema or submandibular swelling.   Eyes: Conjunctivae are normal. Right eye exhibits no discharge. Left eye exhibits no discharge.  Neck: Normal range of motion. Neck supple. No tracheal deviation present.  Cardiovascular: Normal rate and regular rhythm.   Pulmonary/Chest: Effort normal and breath sounds normal.  Abdominal: Soft. She exhibits no distension. There is no tenderness. There is no guarding.  Musculoskeletal: She exhibits no edema.  Neurological: She is alert and oriented to person, place, and time. No cranial nerve deficit.  Skin: Skin is warm. No rash noted.  Psychiatric: She has a normal mood and affect.    ED Course  Procedures (including critical care time) Labs Review Labs Reviewed  RAPID STREP SCREEN    Imaging Review Dg Chest 2 View  04/01/2014   CLINICAL DATA:  Cough.  EXAM: CHEST  2 VIEW  COMPARISON:  Chest x-ray 02/19/2014, 05/29/2013  FINDINGS: Mediastinum and hilar structures normal. Mild scarring left lung base. Heart size normal. No pleural effusion or pneumothorax. Mild left paraspinal soft tissue fullness unchanged from multiple prior exams. Degenerative changes thoracic spine.  IMPRESSION: Mild scarring left lung base, otherwise negative chest.   Electronically Signed   By: Maisie Fushomas  Register   On: 04/01/2014 17:01     EKG Interpretation None      MDM   Final diagnoses:  Bronchitis  Sore throat   Overall  well-appearing elderly female with viral-like symptoms/illness. Plan for strep, chest x-ray, Zofran and oral fluids. Reasons to return in followup discussed.  Patient well-appearing in ER and chest x-ray reviewed no acute findings. Strep test negative. Patient tolerating by mouth. Plan for doxycycline of no improvement 24 hours and Zofran as needed for nausea with outpatient followup.  Results and differential diagnosis w 1 second and ere discussed with the patient/parent/guardian. Close follow up outpatient was discussed, comfortable with the plan.   Filed Vitals:   04/01/14 1538  BP: 155/73  Pulse: 83  Temp: 99 F (37.2 C)  TempSrc: Oral  Resp: 20  Height: 5\' 2"  (1.575 m)  Weight: 180 lb (81.647 kg)  SpO2: 97%         Enid SkeensJoshua M Saranda Legrande, MD 04/01/14 [phone]

## 2014-04-01 NOTE — Discharge Instructions (Signed)
If you were given medicines take as directed.  If you are on coumadin or contraceptives realize their levels and effectiveness is altered by many different medicines.  If you have any reaction (rash, tongues swelling, other) to the medicines stop taking and see a physician.   If no improvement or worsening productive cough start taking doxycycline and 24 hours. Take Tylenol and Motrin for fevers. Please follow up as directed and return to the ER or see a physician for new or worsening symptoms.  Thank you. Filed Vitals:   04/01/14 1538  BP: 155/73  Pulse: 83  Temp: 99 F (37.2 C)  TempSrc: Oral  Resp: 20  Height: 5\' 2"  (1.575 m)  Weight: 180 lb (81.647 kg)  SpO2: 97%

## 2014-04-03 LAB — CULTURE, GROUP A STREP

## 2014-06-04 ENCOUNTER — Emergency Department (HOSPITAL_BASED_OUTPATIENT_CLINIC_OR_DEPARTMENT_OTHER): Payer: Medicare Other

## 2014-06-04 ENCOUNTER — Emergency Department (HOSPITAL_BASED_OUTPATIENT_CLINIC_OR_DEPARTMENT_OTHER)
Admission: EM | Admit: 2014-06-04 | Discharge: 2014-06-04 | Disposition: A | Discharge status: Home / Self Care | Payer: Medicare Other | Attending: Emergency Medicine | Admitting: Emergency Medicine

## 2014-06-04 ENCOUNTER — Encounter (HOSPITAL_BASED_OUTPATIENT_CLINIC_OR_DEPARTMENT_OTHER): Payer: Self-pay | Admitting: Emergency Medicine

## 2014-06-04 DIAGNOSIS — J3489 Other specified disorders of nose and nasal sinuses: Secondary | ICD-10-CM | POA: Insufficient documentation

## 2014-06-04 DIAGNOSIS — Z8669 Personal history of other diseases of the nervous system and sense organs: Secondary | ICD-10-CM | POA: Insufficient documentation

## 2014-06-04 DIAGNOSIS — M542 Cervicalgia: Secondary | ICD-10-CM | POA: Diagnosis not present

## 2014-06-04 DIAGNOSIS — Z880 Allergy status to penicillin: Secondary | ICD-10-CM | POA: Diagnosis not present

## 2014-06-04 DIAGNOSIS — G8929 Other chronic pain: Secondary | ICD-10-CM | POA: Insufficient documentation

## 2014-06-04 DIAGNOSIS — Z7982 Long term (current) use of aspirin: Secondary | ICD-10-CM | POA: Diagnosis not present

## 2014-06-04 DIAGNOSIS — K219 Gastro-esophageal reflux disease without esophagitis: Secondary | ICD-10-CM | POA: Insufficient documentation

## 2014-06-04 DIAGNOSIS — Z87891 Personal history of nicotine dependence: Secondary | ICD-10-CM | POA: Insufficient documentation

## 2014-06-04 DIAGNOSIS — I10 Essential (primary) hypertension: Secondary | ICD-10-CM | POA: Diagnosis not present

## 2014-06-04 DIAGNOSIS — R51 Headache: Secondary | ICD-10-CM | POA: Insufficient documentation

## 2014-06-04 DIAGNOSIS — E785 Hyperlipidemia, unspecified: Secondary | ICD-10-CM | POA: Insufficient documentation

## 2014-06-04 DIAGNOSIS — J45909 Unspecified asthma, uncomplicated: Secondary | ICD-10-CM | POA: Diagnosis not present

## 2014-06-04 DIAGNOSIS — Z792 Long term (current) use of antibiotics: Secondary | ICD-10-CM | POA: Diagnosis not present

## 2014-06-04 DIAGNOSIS — Z79899 Other long term (current) drug therapy: Secondary | ICD-10-CM | POA: Insufficient documentation

## 2014-06-04 DIAGNOSIS — R079 Chest pain, unspecified: Secondary | ICD-10-CM | POA: Diagnosis not present

## 2014-06-04 DIAGNOSIS — R42 Dizziness and giddiness: Secondary | ICD-10-CM | POA: Diagnosis not present

## 2014-06-04 LAB — URINALYSIS, ROUTINE W REFLEX MICROSCOPIC
Bilirubin Urine: NEGATIVE
Glucose, UA: NEGATIVE mg/dL
Hgb urine dipstick: NEGATIVE
Ketones, ur: NEGATIVE mg/dL
NITRITE: NEGATIVE
PH: 6 (ref 5.0–8.0)
Protein, ur: NEGATIVE mg/dL
SPECIFIC GRAVITY, URINE: 1.014 (ref 1.005–1.030)
Urobilinogen, UA: 0.2 mg/dL (ref 0.0–1.0)

## 2014-06-04 LAB — CBC WITH DIFFERENTIAL/PLATELET
Basophils Absolute: 0 10*3/uL (ref 0.0–0.1)
Basophils Relative: 0 % (ref 0–1)
Eosinophils Absolute: 0.1 10*3/uL (ref 0.0–0.7)
Eosinophils Relative: 3 % (ref 0–5)
HEMATOCRIT: 38.5 % (ref 36.0–46.0)
Hemoglobin: 12.7 g/dL (ref 12.0–15.0)
LYMPHS ABS: 1.7 10*3/uL (ref 0.7–4.0)
LYMPHS PCT: 33 % (ref 12–46)
MCH: 28.5 pg (ref 26.0–34.0)
MCHC: 33 g/dL (ref 30.0–36.0)
MCV: 86.3 fL (ref 78.0–100.0)
MONOS PCT: 7 % (ref 3–12)
Monocytes Absolute: 0.4 10*3/uL (ref 0.1–1.0)
Neutro Abs: 3 10*3/uL (ref 1.7–7.7)
Neutrophils Relative %: 57 % (ref 43–77)
PLATELETS: 245 10*3/uL (ref 150–400)
RBC: 4.46 MIL/uL (ref 3.87–5.11)
RDW: 15.8 % — AB (ref 11.5–15.5)
WBC: 5.2 10*3/uL (ref 4.0–10.5)

## 2014-06-04 LAB — TROPONIN I: Troponin I: <0.3 ng/mL (ref <0.30)

## 2014-06-04 LAB — URINE MICROSCOPIC-ADD ON

## 2014-06-04 LAB — BASIC METABOLIC PANEL
Anion gap: 9 (ref 5–15)
BUN: 22 mg/dL (ref 6–23)
CO2: 31 meq/L (ref 19–32)
Calcium: 9.8 mg/dL (ref 8.4–10.5)
Chloride: 106 mEq/L (ref 96–112)
Creatinine, Ser: 0.9 mg/dL (ref 0.50–1.10)
GFR calc Af Amer: 76 mL/min — ABNORMAL LOW (ref >90)
GFR calc non Af Amer: 66 mL/min — ABNORMAL LOW (ref >90)
GLUCOSE: 85 mg/dL (ref 70–99)
POTASSIUM: 4.8 meq/L (ref 3.7–5.3)
SODIUM: 146 meq/L (ref 137–147)

## 2014-06-04 MED ORDER — OXYCODONE-ACETAMINOPHEN 5-325 MG PO TABS
1.0000 | ORAL_TABLET | Freq: Once | ORAL | Status: AC
  Administered 2014-06-04: 1 via ORAL
  Filled 2014-06-04: qty 1

## 2014-06-04 MED ORDER — OXYCODONE-ACETAMINOPHEN 5-325 MG PO TABS: 1.0000 | ORAL_TABLET | Freq: Three times a day (TID) | ORAL | Status: DC | PRN

## 2014-06-04 NOTE — ED Provider Notes (Signed)
CSN: [id number]     Arrival date & time 06/04/14  1448 History   First MD Initiated Contact with Patient 06/04/14 1506     Chief Complaint  Patient presents with  . Nasal Congestion     (Consider location/radiation/quality/duration/timing/severity/associated sxs/prior Treatment) HPI Comments: Patient with history of cervical radiculopathy, TIA presents with complaint of neck pain, lightheadedness. Patient states that she awoke this morning with her typical right-sided neck pain and associated headache. She took some ibuprofen and went back to sleep. Patient states that the ibuprofen helped. After waking up, she started feeling worse. During this time, patient had her typical symptoms. At some point later patient was driving in a car and became lightheaded. She became very anxious and scared. She drove to her daughter's house which was 2 minutes away and began feeling better. At some point, her typical shoulder and neck pain radiated to her chest. The symptoms are currently resolved. Patient currently has her typical mild headache. Patient denies signs of stroke including: facial droop, slurred speech, aphasia, weakness/numbness in extremities, imbalance/trouble walking. The onset of this condition was acute. The course is constant. Aggravating factors: none. Alleviating factors: none.     The history is provided by the patient and medical records.    Past Medical History  Diagnosis Date  . Hypertension   . Vertigo   . Hyperlipidemia   . Pinched nerve in neck     neck and shoulder  . Asthma   . Reflux   . Hiatal hernia   . Pre-diabetes    Past Surgical History  Procedure Laterality Date  . Abdominal hysterectomy     No family history on file. History  Substance Use Topics  . Smoking status: Former Smoker -- 0.50 packs/day    Quit date: 12/21/2012  . Smokeless tobacco: Never Used  . Alcohol Use: No   OB History   Grav Para Term Preterm Abortions TAB SAB Ect Mult Living                  Review of Systems  Constitutional: Negative for fever.  HENT: Negative for congestion, dental problem, rhinorrhea and sinus pressure.   Eyes: Negative for photophobia, discharge, redness and visual disturbance.  Respiratory: Negative for shortness of breath.   Cardiovascular: Positive for chest pain (short-lived).  Gastrointestinal: Negative for nausea and vomiting.  Musculoskeletal: Positive for neck pain. Negative for gait problem and neck stiffness.  Skin: Negative for rash.  Neurological: Positive for light-headedness and headaches. Negative for syncope, speech difficulty, weakness and numbness.  Psychiatric/Behavioral: Negative for confusion.      Allergies  Codeine; Iodine; Penicillins; Prednisone; and Sulfa antibiotics  Home Medications   Prior to Admission medications   Medication Sig Start Date End Date Taking? Authorizing Provider  albuterol (PROVENTIL) (2.5 MG/3ML) 0.083% nebulizer solution Take 2.5 mg by nebulization every 6 (six) hours as needed for wheezing or shortness of breath.    Historical Provider, MD  ALPRAZolam Prudy Feeler(XANAX) 0.25 MG tablet Take 1 tablet (0.25 mg total) by mouth 3 (three) times daily as needed for anxiety. 03/08/13   Carlisle BeersJohn L Molpus, MD  aspirin 81 MG tablet Take 81 mg by mouth daily.    Historical Provider, MD  azithromycin (ZITHROMAX) 250 MG tablet Take 1 tablet (250 mg total) by mouth daily. Take first 2 tablets together, then 1 every day until finished. 10/18/13   Elson AreasLeslie K Sofia, PA-C  beclomethasone (QVAR) 40 MCG/ACT inhaler Inhale 2 puffs into the lungs 2 (two) times  daily.    Historical Provider, MD  cholecalciferol (VITAMIN D) 1000 UNITS tablet Take 1,000 Units by mouth daily.    Historical Provider, MD  doxycycline (VIBRAMYCIN) 100 MG capsule Take 1 capsule (100 mg total) by mouth 2 (two) times daily. One po bid x 7 days 04/01/14   Enid Skeens, MD  esomeprazole (NEXIUM) 40 MG capsule Take 40 mg by mouth daily before breakfast.     Historical Provider, MD  HYDROcodone-acetaminophen (NORCO/VICODIN) 5-325 MG per tablet Take 2 tablets by mouth every 4 (four) hours as needed. 10/30/13   Elson Areas, PA-C  lisinopril (PRINIVIL,ZESTRIL) 10 MG tablet Take 10 mg by mouth daily.    Historical Provider, MD  meclizine (ANTIVERT) 25 MG tablet Take 25 mg by mouth 3 (three) times daily as needed.    Historical Provider, MD  metFORMIN (GLUCOPHAGE) 500 MG tablet Take 500 mg by mouth. Half tab with meal at evening    Historical Provider, MD  montelukast (SINGULAIR) 10 MG tablet Take 10 mg by mouth as needed.    Historical Provider, MD  NASONEX 50 MCG/ACT nasal spray  09/30/13   Historical Provider, MD  ondansetron (ZOFRAN ODT) 4 MG disintegrating tablet 4mg  ODT q4 hours prn nausea/vomit 04/01/14   Enid Skeens, MD  pravastatin (PRAVACHOL) 20 MG tablet Take 20 mg by mouth daily.    Historical Provider, MD   BP 145/85  Pulse 80  Temp(Src) 98.2 F (36.8 C) (Oral)  Ht 5\' 2"  (1.575 m)  Wt 179 lb (81.194 kg)  BMI 32.73 kg/m2  SpO2 96%  Physical Exam  Nursing note and vitals reviewed. Constitutional: She is oriented to person, place, and time. She appears well-developed and well-nourished.  HENT:  Head: Normocephalic and atraumatic.  Right Ear: Tympanic membrane, external ear and ear canal normal.  Left Ear: Tympanic membrane, external ear and ear canal normal.  Nose: Nose normal.  Mouth/Throat: Uvula is midline, oropharynx is clear and moist and mucous membranes are normal.  Eyes: Conjunctivae, EOM and lids are normal. Pupils are equal, round, and reactive to light. Right eye exhibits no nystagmus. Left eye exhibits no nystagmus.  Neck: Normal range of motion. Neck supple.  Cardiovascular: Normal rate, regular rhythm and normal heart sounds.   No murmur heard. Pulmonary/Chest: Effort normal and breath sounds normal. No respiratory distress. She has no wheezes. She has no rales.  Abdominal: Soft. There is no tenderness. There is no  rebound and no guarding.  Musculoskeletal:       Cervical back: She exhibits normal range of motion, no tenderness and no bony tenderness.  Neurological: She is alert and oriented to person, place, and time. She has normal strength and normal reflexes. No cranial nerve deficit or sensory deficit. She displays a negative Romberg sign. Coordination and gait normal. GCS eye subscore is 4. GCS verbal subscore is 5. GCS motor subscore is 6.  Skin: Skin is warm and dry.  Psychiatric: She has a normal mood and affect.    ED Course  Procedures (including critical care time) Labs Review Labs Reviewed  CBC WITH DIFFERENTIAL - Abnormal; Notable for the following:    RDW 15.8 (*)    All other components within normal limits  BASIC METABOLIC PANEL - Abnormal; Notable for the following:    GFR calc non Af Amer 66 (*)    GFR calc Af Amer 76 (*)    All other components within normal limits  URINALYSIS, ROUTINE W REFLEX MICROSCOPIC - Abnormal; Notable  for the following:    Leukocytes, UA TRACE (*)    All other components within normal limits  TROPONIN I  URINE MICROSCOPIC-ADD ON  TROPONIN I    Imaging Review Ct Head Wo Contrast  06/04/2014   CLINICAL DATA:  Headache  EXAM: CT HEAD WITHOUT CONTRAST  TECHNIQUE: Contiguous axial images were obtained from the base of the skull through the vertex without intravenous contrast.  COMPARISON:  04/10/2014  FINDINGS: No skull fracture is noted. Paranasal sinuses and mastoid air cells are unremarkable.  No intracranial hemorrhage, mass effect or midline shift. Mild cerebral atrophy. Chronic right frontal white matter infarct is stable. No acute cortical infarction. No mass lesion is noted on this unenhanced scan.  IMPRESSION: No acute intracranial abnormality. 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   Electronically Signed   By: Natasha Mead M.D.   On: 06/04/2014 15:55     EKG Interpretation   Date/Time:  Wednesday June 04 2014 16:14:36  EDT Ventricular Rate:  88 PR Interval:  138 QRS Duration: 80 QT Interval:  364 QTC Calculation: 440 R Axis:   10 Text Interpretation:  Normal sinus rhythm Low voltage QRS Borderline ECG  No significant change was found Confirmed by Manus Gunning  MD, STEPHEN ([id number])  on 06/04/2014 4:28:02 PM      3:36 PM Patient seen and examined.   Vital signs reviewed and are as follows: Filed Vitals:   06/04/14 1458  BP: 145/85  Pulse: 80  Temp: 98.2 F (36.8 C)   Patient is a very poor historian and is very unorganized with her history. I discussed patient with Dr. Manus Gunning who has seen patient. Low suspicion for stroke, ACS, SAH. Will obtain CT, labs, troponin.    7:03 PM patient has ambulated. She has tolerated oral fluids. Patient informed of results.  We'll discharge to home with prescription for pain medicine. She's taken this medication in the past. She is instructed to use it only with severe pain under supervision of family. Patient counseled on use of narcotic pain medications. Counseled not to combine these medications with others containing tylenol. Urged not to drink alcohol, drive, or perform any other activities that requires focus while taking these medications. The patient verbalizes understanding and agrees with the plan.  Patient encouraged to follow up with her primary care physician this week for recheck of her symptoms.    MDM   Final diagnoses:  Neck pain   Patient with multiple complaints seeming to stem from her chronic neck pain, probable radiculopathy. Workup was done today due to vague complaints of headache, lightheadedness, chest pain. Workup is reassuring. No new strokes. Nonischemic EKG with 2 sets of negative markers. Patient has normal neurological exam. Symptoms controlled in the emergency department.   No dangerous or life-threatening conditions suspected or identified by history, physical exam, and by work-up. No indications for hospitalization identified.    She is undergoing evaluation and treatment for her chronic neck pain.     Renne Crigler, PA-C 06/04/14 [phone]

## 2014-06-04 NOTE — Discharge Instructions (Signed)
Please read and follow all provided instructions.  Your diagnoses today include:  1. Neck pain    Tests performed today include:  Vital signs - see below for your results today  Head CT - shows no new strokes  Blood test for heart attack - no signs of heart attack  Blood counts and electrolytes  Medications prescribed:   Percocet (oxycodone/acetaminophen) - narcotic pain medication  DO NOT drive or perform any activities that require you to be awake and alert because this medicine can make you drowsy. BE VERY CAREFUL not to take multiple medicines containing Tylenol (also called acetaminophen). Doing so can lead to an overdose which can damage your liver and cause liver failure and possibly death.  Take any prescribed medications only as directed.  Home care instructions:   Follow any educational materials contained in this packet  Please rest, use ice or heat on your neck for the next several days  Do not lift, push, pull anything more than 10 pounds for the next week  Follow-up instructions: Please follow-up with your primary care provider in the next 1 week for further evaluation of your symptoms.   Return instructions:  SEEK IMMEDIATE MEDICAL ATTENTION IF YOU HAVE:  New numbness, tingling, weakness, or problem with the use of your arms or legs  Severe back pain not relieved with medications  Loss control of your bowels or bladder  Increasing pain in any areas of the body (such as chest or abdominal pain)  Shortness of breath, dizziness, or fainting.   Worsening nausea (feeling sick to your stomach), vomiting, fever, or sweats  Any other emergent concerns regarding your health   Additional Information:  Your vital signs today were: BP 154/82   Pulse 67   Temp(Src) 98.2 F (36.8 C) (Oral)   Resp 16   Ht 5\' 2"  (1.575 m)   Wt 179 lb (81.194 kg)   BMI 32.73 kg/m2   SpO2 94% If your blood pressure (BP) was elevated above 135/85 this visit, please have this  repeated by your doctor within one month. --------------

## 2014-06-04 NOTE — ED Notes (Signed)
Patient transported to CT 

## 2014-06-04 NOTE — ED Notes (Signed)
Reports pinched nerve in neck (left). Sts that "when it acts it, I get all congested." Reports some difficulty taking a deep breathe sometimes.

## 2014-06-04 NOTE — ED Notes (Signed)
MD at bedside. 

## 2014-06-05 NOTE — ED Provider Notes (Signed)
Medical screening examination/treatment/procedure(s) were conducted as a shared visit with non-physician practitioner(s) and myself.  I personally evaluated the patient during the encounter.  Hx cervical radiculopathy with ongoing R sided neck and arm pain.  Developed lightheadedness and near syncope while driving. No vertigo or vision change.  No focal weakness, numbness or tingling. No difficulty speaking or swallowing.  CN 2-12 intact, no ataxia on finger to nose, no nystagmus, 5/5 strength throughout, no pronator drift, Romberg negative, normal gait. Denies sudden onset headache, change in chronic RUE pain. Equal grip strengths. Doubt CVA, TIA, SAH, temporal arteritis, meningitis. EKG unchanged, troponin negative x2.   EKG Interpretation   Date/Time:  Wednesday June 04 2014 16:14:36 EDT Ventricular Rate:  88 PR Interval:  138 QRS Duration: 80 QT Interval:  364 QTC Calculation: 440 R Axis:   10 Text Interpretation:  Normal sinus rhythm Low voltage QRS Borderline ECG  No significant change was found Confirmed by Manus GunningANCOUR  MD, Rosmarie Esquibel ([id number])  on 06/04/2014 4:28:02 PM       Glynn OctaveStephen Srah Ake, MD 06/05/14 [phone]

## 2014-07-08 ENCOUNTER — Encounter (HOSPITAL_BASED_OUTPATIENT_CLINIC_OR_DEPARTMENT_OTHER): Payer: Self-pay | Admitting: Emergency Medicine

## 2014-07-08 ENCOUNTER — Emergency Department (HOSPITAL_BASED_OUTPATIENT_CLINIC_OR_DEPARTMENT_OTHER)
Admission: EM | Admit: 2014-07-08 | Discharge: 2014-07-08 | Disposition: A | Discharge status: Home / Self Care | Payer: Medicare Other | Attending: Emergency Medicine | Admitting: Emergency Medicine

## 2014-07-08 DIAGNOSIS — Z87891 Personal history of nicotine dependence: Secondary | ICD-10-CM | POA: Diagnosis not present

## 2014-07-08 DIAGNOSIS — Z880 Allergy status to penicillin: Secondary | ICD-10-CM | POA: Diagnosis not present

## 2014-07-08 DIAGNOSIS — J3489 Other specified disorders of nose and nasal sinuses: Secondary | ICD-10-CM

## 2014-07-08 DIAGNOSIS — Z7982 Long term (current) use of aspirin: Secondary | ICD-10-CM | POA: Diagnosis not present

## 2014-07-08 DIAGNOSIS — J45909 Unspecified asthma, uncomplicated: Secondary | ICD-10-CM | POA: Diagnosis not present

## 2014-07-08 DIAGNOSIS — H9209 Otalgia, unspecified ear: Secondary | ICD-10-CM | POA: Diagnosis not present

## 2014-07-08 DIAGNOSIS — Z79899 Other long term (current) drug therapy: Secondary | ICD-10-CM | POA: Insufficient documentation

## 2014-07-08 DIAGNOSIS — E785 Hyperlipidemia, unspecified: Secondary | ICD-10-CM | POA: Diagnosis not present

## 2014-07-08 DIAGNOSIS — I10 Essential (primary) hypertension: Secondary | ICD-10-CM | POA: Insufficient documentation

## 2014-07-08 DIAGNOSIS — K219 Gastro-esophageal reflux disease without esophagitis: Secondary | ICD-10-CM | POA: Insufficient documentation

## 2014-07-08 DIAGNOSIS — IMO0002 Reserved for concepts with insufficient information to code with codable children: Secondary | ICD-10-CM | POA: Diagnosis not present

## 2014-07-08 MED ORDER — DEXAMETHASONE SODIUM PHOSPHATE 4 MG/ML IJ SOLN
INTRAMUSCULAR | Status: AC
  Administered 2014-07-08: 8 mg
  Filled 2014-07-08: qty 1

## 2014-07-08 MED ORDER — DEXAMETHASONE SODIUM PHOSPHATE 10 MG/ML IJ SOLN: 8.0000 mg | Freq: Once | INTRAMUSCULAR | Status: DC

## 2014-07-08 MED ORDER — DEXAMETHASONE SODIUM PHOSPHATE 10 MG/ML IJ SOLN: 10.0000 mg | Freq: Once | INTRAMUSCULAR | Status: DC

## 2014-07-08 NOTE — ED Notes (Signed)
Sinus congestion x 1 week  

## 2014-07-08 NOTE — ED Provider Notes (Signed)
CSN: [id number]     Arrival date & time 07/08/14  1530 History   First MD Initiated Contact with Patient 07/08/14 1542     Chief Complaint  Patient presents with  . Nasal Congestion     (Consider location/radiation/quality/duration/timing/severity/associated sxs/prior Treatment) Patient is a 68 y.o. female presenting with general illness.  Illness Location:  Frontal and maxillary sinuses Quality:  Soreness Severity:  Moderate Onset quality:  Gradual Timing:  Constant Progression:  Worsening Chronicity:  Recurrent Context:  Rhinorrhea Relieved by:  Nothing Worsened by:  Palpation Associated symptoms: ear pain and rhinorrhea   Associated symptoms: no abdominal pain, no chest pain, no congestion, no cough, no diarrhea, no fever, no loss of consciousness, no nausea, no rash, no shortness of breath, no sore throat and no vomiting     Past Medical History  Diagnosis Date  . Hypertension   . Vertigo   . Hyperlipidemia   . Pinched nerve in neck     neck and shoulder  . Asthma   . Reflux   . Hiatal hernia   . Pre-diabetes    Past Surgical History  Procedure Laterality Date  . Abdominal hysterectomy     No family history on file. History  Substance Use Topics  . Smoking status: Former Smoker -- 0.50 packs/day    Quit date: 12/21/2012  . Smokeless tobacco: Never Used  . Alcohol Use: No   OB History   Grav Para Term Preterm Abortions TAB SAB Ect Mult Living                 Review of Systems  Constitutional: Negative for fever and chills.  HENT: Positive for ear pain and rhinorrhea. Negative for congestion and sore throat.   Eyes: Negative for photophobia and visual disturbance.  Respiratory: Negative for cough and shortness of breath.   Cardiovascular: Negative for chest pain and leg swelling.  Gastrointestinal: Negative for nausea, vomiting, abdominal pain, diarrhea and constipation.  Endocrine: Negative for polyphagia and polyuria.  Genitourinary: Negative for  dysuria, flank pain, vaginal bleeding, vaginal discharge and enuresis.  Musculoskeletal: Negative for back pain and gait problem.  Skin: Negative for color change and rash.  Neurological: Negative for dizziness, loss of consciousness, syncope, light-headedness and numbness.  Hematological: Negative for adenopathy. Does not bruise/bleed easily.  All other systems reviewed and are negative.     Allergies  Codeine; Iodine; Penicillins; Prednisone; Sulfa antibiotics; and Vicodin  Home Medications   Prior to Admission medications   Medication Sig Start Date End Date Taking? Authorizing Provider  albuterol (PROVENTIL) (2.5 MG/3ML) 0.083% nebulizer solution Take 2.5 mg by nebulization every 6 (six) hours as needed for wheezing or shortness of breath.    Historical Provider, MD  ALPRAZolam Prudy Feeler) 0.25 MG tablet Take 1 tablet (0.25 mg total) by mouth 3 (three) times daily as needed for anxiety. 03/08/13   Carlisle Beers Molpus, MD  aspirin 81 MG tablet Take 81 mg by mouth daily.    Historical Provider, MD  azithromycin (ZITHROMAX) 250 MG tablet Take 1 tablet (250 mg total) by mouth daily. Take first 2 tablets together, then 1 every day until finished. 10/18/13   Elson Areas, PA-C  beclomethasone (QVAR) 40 MCG/ACT inhaler Inhale 2 puffs into the lungs 2 (two) times daily.    Historical Provider, MD  cholecalciferol (VITAMIN D) 1000 UNITS tablet Take 1,000 Units by mouth daily.    Historical Provider, MD  doxycycline (VIBRAMYCIN) 100 MG capsule Take 1 capsule (100 mg total)  by mouth 2 (two) times daily. One po bid x 7 days 04/01/14   Enid Skeens, MD  esomeprazole (NEXIUM) 40 MG capsule Take 40 mg by mouth daily before breakfast.    Historical Provider, MD  HYDROcodone-acetaminophen (NORCO/VICODIN) 5-325 MG per tablet Take 2 tablets by mouth every 4 (four) hours as needed. 10/30/13   Elson Areas, PA-C  lisinopril (PRINIVIL,ZESTRIL) 10 MG tablet Take 10 mg by mouth daily.    Historical Provider, MD   meclizine (ANTIVERT) 25 MG tablet Take 25 mg by mouth 3 (three) times daily as needed.    Historical Provider, MD  metFORMIN (GLUCOPHAGE) 500 MG tablet Take 500 mg by mouth. Half tab with meal at evening    Historical Provider, MD  montelukast (SINGULAIR) 10 MG tablet Take 10 mg by mouth as needed.    Historical Provider, MD  NASONEX 50 MCG/ACT nasal spray  09/30/13   Historical Provider, MD  ondansetron (ZOFRAN ODT) 4 MG disintegrating tablet  ODT q4 hours prn nausea/vomit 04/01/14   Enid Skeens, MD  oxyCODONE-acetaminophen (PERCOCET/ROXICET) 5-325 MG per tablet Take 1 tablet by mouth every 8 (eight) hours as needed for severe pain. 06/04/14   Renne Crigler, PA-C  pravastatin (PRAVACHOL) 20 MG tablet Take 20 mg by mouth daily.    Historical Provider, MD   BP 137/70  Pulse 70  Temp(Src) 98.3 F (36.8 C) (Oral)  Resp 16  Ht  (1.575 m)  Wt 180 lb (81.647 kg)  BMI 32.91 kg/m2  SpO2 98% Physical Exam  Vitals reviewed. Constitutional: She is oriented to person, place, and time. She appears well-developed and well-nourished.  HENT:  Head: Normocephalic and atraumatic.  Right Ear: External ear normal. Tympanic membrane is not erythematous. A middle ear effusion is present.  Left Ear: External ear normal. Tympanic membrane is not erythematous. A middle ear effusion is present.  Mouth/Throat: Oropharynx is clear and moist and mucous membranes are normal.  Eyes: Conjunctivae and EOM are normal. Pupils are equal, round, and reactive to light.  Neck: Normal range of motion. Neck supple.  Cardiovascular: Normal rate, regular rhythm, normal heart sounds and intact distal pulses.   Pulmonary/Chest: Effort normal and breath sounds normal.  Abdominal: Soft. Bowel sounds are normal. There is no tenderness.  Musculoskeletal: Normal range of motion.  Neurological: She is alert and oriented to person, place, and time.  Skin: Skin is warm and dry.    ED Course  Procedures (including critical  care time) Labs Review Labs Reviewed - No data to display  Imaging Review No results found.   EKG Interpretation None      MDM   Final diagnoses:  Sinus pressure    68 y.o. female  with pertinent PMH of prior sinus infection, chronic neck pain presents with recurrent sinus pressure times one week. Patient denies fever, cough, nausea, vomiting, other infectious symptoms.  She states that she has had identical symptoms in the past with prior sinus infections. She has had no decrease in by mouth intake. On arrival vital signs and physical exam as above. Patient states that steroids typically help her symptoms relieve the fastest. Consider this reasonable given URI and nature of symptoms. Decadron administered intramuscular as patient is intolerant of by mouth steroids. Patient discharged home in stable condition.    Labs and imaging as above reviewed.   1. Sinus pressure         Mirian Mo, MD 07/08/14 [phone]

## 2014-07-08 NOTE — ED Notes (Signed)
Pt states that she is not allergic to prednisone- the tabs make her nauseous

## 2014-07-08 NOTE — Discharge Instructions (Signed)
Sinus Headache A sinus headache is when your sinuses become clogged or swollen. Sinus headaches can range from mild to severe.  CAUSES A sinus headache can have different causes, such as: Colds.Sinusitis Sinusitis is redness, soreness, and inflammation of the paranasal sinuses. Paranasal sinuses are air pockets within the bones of your face (beneath the eyes, the middle of the forehead, or above the eyes). In healthy paranasal sinuses, mucus is able to drain out, and air is able to circulate through them by way of your nose. However, when your paranasal sinuses are inflamed, mucus and air can become trapped. This can allow bacteria and other germs to grow and cause infection. Sinusitis can develop quickly and last only a short time (acute) or continue over a long period (chronic). Sinusitis that lasts for more than 12 weeks is considered chronic.  CAUSES  Causes of sinusitis include:  Allergies.  Structural abnormalities, such as displacement of the cartilage that separates your nostrils (deviated septum), which can decrease the air flow through your nose and sinuses and affect sinus drainage.  Functional abnormalities, such as when the small hairs (cilia) that line your sinuses and help remove mucus do not work properly or are not present. SIGNS AND SYMPTOMS  Symptoms of acute and chronic sinusitis are the same. The primary symptoms are pain and pressure around the affected sinuses. Other symptoms include:  Upper toothache.  Earache.  Headache.  Bad breath.  Decreased sense of smell and taste.  A cough, which worsens when you are lying flat.  Fatigue.  Fever.  Thick drainage from your nose, which often is green and may contain pus (purulent).  Swelling and warmth over the affected sinuses. DIAGNOSIS  Your health care provider will perform a physical exam. During the exam, your health care provider may:  Look in your nose for signs of abnormal growths in your nostrils (nasal  polyps).  Tap over the affected sinus to check for signs of infection.  View the inside of your sinuses (endoscopy) using an imaging device that has a light attached (endoscope). If your health care provider suspects that you have chronic sinusitis, one or more of the following tests may be recommended:  Allergy tests.  Nasal culture. A sample of mucus is taken from your nose, sent to a lab, and screened for bacteria.  Nasal cytology. A sample of mucus is taken from your nose and examined by your health care provider to determine if your sinusitis is related to an allergy. TREATMENT  Most cases of acute sinusitis are related to a viral infection and will resolve on their own within 10 days. Sometimes medicines are prescribed to help relieve symptoms (pain medicine, decongestants, nasal steroid sprays, or saline sprays).  However, for sinusitis related to a bacterial infection, your health care provider will prescribe antibiotic medicines. These are medicines that will help kill the bacteria causing the infection.  Rarely, sinusitis is caused by a fungal infection. In theses cases, your health care provider will prescribe antifungal medicine. For some cases of chronic sinusitis, surgery is needed. Generally, these are cases in which sinusitis recurs more than 3 times per year, despite other treatments. HOME CARE INSTRUCTIONS   Drink plenty of water. Water helps thin the mucus so your sinuses can drain more easily.  Use a humidifier.  Inhale steam 3 to 4 times a day (for example, sit in the bathroom with the shower running).  Apply a warm, moist washcloth to your face 3 to 4 times a day,  or as directed by your health care provider.  Use saline nasal sprays to help moisten and clean your sinuses.  Take medicines only as directed by your health care provider.  If you were prescribed either an antibiotic or antifungal medicine, finish it all even if you start to feel better. SEEK IMMEDIATE  MEDICAL CARE IF:  You have increasing pain or severe headaches.  You have nausea, vomiting, or drowsiness.  You have swelling around your face.  You have vision problems.  You have a stiff neck.  You have difficulty breathing. MAKE SURE YOU:   Understand these instructions.  Will watch your condition.  Will get help right away if you are not doing well or get worse. Document Released: 10/17/2005 Document Revised: 03/03/2014 Document Reviewed: 11/01/2011 [facility] Patient Information 2015 [place], [place]. This information is not intended to replace advice given to you by your health care provider. Make sure you discuss any questions you have with your health care provider.    Sinus infections.  Allergies. SYMPTOMS  Symptoms of a sinus headache may vary and can include:  Headache.  Pain or pressure in the face.  Congested or runny nose.  Fever.  Inability to smell.  Pain in upper teeth. Weather changes can make symptoms worse. TREATMENT  The treatment of a sinus headache depends on the cause.  Sinus pain caused by a sinus infection may be treated with antibiotic medicine.  Sinus pain caused by allergies may be helped by allergy medicines (antihistamines) and medicated nasal sprays.  Sinus pain caused by congestion may be helped by flushing the nose and sinuses with saline solution. HOME CARE INSTRUCTIONS   If antibiotics are prescribed, take them as directed. Finish them even if you start to feel better.  Only take over-the-counter or prescription medicines for pain, discomfort, or fever as directed by your caregiver.  If you have congestion, use a nasal spray to help reduce pressure. SEEK IMMEDIATE MEDICAL CARE IF:  You have a fever.  You have headaches more than once a week.  You have sensitivity to light or sound.  You have repeated nausea and vomiting.  You have vision problems.  You have sudden, severe pain in your face or head.  You have a  seizure.  You are confused.  Your sinus headaches do not get better after treatment. Many people think they have a sinus headache when they actually have migraines or tension headaches. MAKE SURE YOU:   Understand these instructions.  Will watch your condition.  Will get help right away if you are not doing well or get worse. Document Released: 11/24/2004 Document Revised: 01/09/2012 Document Reviewed: 01/15/2011 [facility] Patient Information 2015 [place], [place]. This information is not intended to replace advice given to you by your health care provider. Make sure you discuss any questions you have with your health care provider. Upper Respiratory Infection, Adult An upper respiratory infection (URI) is also sometimes known as the common cold. The upper respiratory tract includes the nose, sinuses, throat, trachea, and bronchi. Bronchi are the airways leading to the lungs. Most people improve within 1 week, but symptoms can last up to 2 weeks. A residual cough may last even longer.  CAUSES Many different viruses can infect the tissues lining the upper respiratory tract. The tissues become irritated and inflamed and often become very moist. Mucus production is also common. A cold is contagious. You can easily spread the virus to others by oral contact. This includes kissing, sharing a glass, coughing, or  sneezing. Touching your mouth or nose and then touching a surface, which is then touched by another person, can also spread the virus. SYMPTOMS  Symptoms typically develop 1 to 3 days after you come in contact with a cold virus. Symptoms vary from person to person. They may include:  Runny nose.  Sneezing.  Nasal congestion.  Sinus irritation.  Sore throat.  Loss of voice (laryngitis).  Cough.  Fatigue.  Muscle aches.  Loss of appetite.  Headache.  Low-grade fever. DIAGNOSIS  You might diagnose your own cold based on familiar symptoms, since most people get a cold 2 to 3  times a year. Your caregiver can confirm this based on your exam. Most importantly, your caregiver can check that your symptoms are not due to another disease such as strep throat, sinusitis, pneumonia, asthma, or epiglottitis. Blood tests, throat tests, and X-rays are not necessary to diagnose a common cold, but they may sometimes be helpful in excluding other more serious diseases. Your caregiver will decide if any further tests are required. RISKS AND COMPLICATIONS  You may be at risk for a more severe case of the common cold if you smoke cigarettes, have chronic heart disease (such as heart failure) or lung disease (such as asthma), or if you have a weakened immune system. The very young and very old are also at risk for more serious infections. Bacterial sinusitis, middle ear infections, and bacterial pneumonia can complicate the common cold. The common cold can worsen asthma and chronic obstructive pulmonary disease (COPD). Sometimes, these complications can require emergency medical care and may be life-threatening. PREVENTION  The best way to protect against getting a cold is to practice good hygiene. Avoid oral or hand contact with people with cold symptoms. Wash your hands often if contact occurs. There is no clear evidence that vitamin C, vitamin E, echinacea, or exercise reduces the chance of developing a cold. However, it is always recommended to get plenty of rest and practice good nutrition. TREATMENT  Treatment is directed at relieving symptoms. There is no cure. Antibiotics are not effective, because the infection is caused by a virus, not by bacteria. Treatment may include:  Increased fluid intake. Sports drinks offer valuable electrolytes, sugars, and fluids.  Breathing heated mist or steam (vaporizer or shower).  Eating chicken soup or other clear broths, and maintaining good nutrition.  Getting plenty of rest.  Using gargles or lozenges for comfort.  Controlling fevers with  ibuprofen or acetaminophen as directed by your caregiver.  Increasing usage of your inhaler if you have asthma. Zinc gel and zinc lozenges, taken in the first 24 hours of the common cold, can shorten the duration and lessen the severity of symptoms. Pain medicines may help with fever, muscle aches, and throat pain. A variety of non-prescription medicines are available to treat congestion and runny nose. Your caregiver can make recommendations and may suggest nasal or lung inhalers for other symptoms.  HOME CARE INSTRUCTIONS   Only take over-the-counter or prescription medicines for pain, discomfort, or fever as directed by your caregiver.  Use a warm mist humidifier or inhale steam from a shower to increase air moisture. This may keep secretions moist and make it easier to breathe.  Drink enough water and fluids to keep your urine clear or pale yellow.  Rest as needed.  Return to work when your temperature has returned to normal or as your caregiver advises. You may need to stay home longer to avoid infecting others. You can also  use a face mask and careful hand washing to prevent spread of the virus. SEEK MEDICAL CARE IF:   After the first few days, you feel you are getting worse rather than better.  You need your caregiver's advice about medicines to control symptoms.  You develop chills, worsening shortness of breath, or brown or red sputum. These may be signs of pneumonia.  You develop yellow or brown nasal discharge or pain in the face, especially when you bend forward. These may be signs of sinusitis.  You develop a fever, swollen neck glands, pain with swallowing, or white areas in the back of your throat. These may be signs of strep throat. SEEK IMMEDIATE MEDICAL CARE IF:   You have a fever.  You develop severe or persistent headache, ear pain, sinus pain, or chest pain.  You develop wheezing, a prolonged cough, cough up blood, or have a change in your usual mucus (if you have  chronic lung disease).  You develop sore muscles or a stiff neck. Document Released: 04/12/2001 Document Revised: 01/09/2012 Document Reviewed: 01/22/2014 [facility] Patient Information 2015 [place], [place]. This information is not intended to replace advice given to you by your health care provider. Make sure you discuss any questions you have with your health care provider.

## 2014-08-07 ENCOUNTER — Encounter (HOSPITAL_BASED_OUTPATIENT_CLINIC_OR_DEPARTMENT_OTHER): Payer: Self-pay | Admitting: Emergency Medicine

## 2014-08-07 ENCOUNTER — Emergency Department (HOSPITAL_BASED_OUTPATIENT_CLINIC_OR_DEPARTMENT_OTHER)
Admission: EM | Admit: 2014-08-07 | Discharge: 2014-08-07 | Disposition: A | Discharge status: Other Acute Inpatient Hospital | Payer: Medicare Other | Attending: Emergency Medicine | Admitting: Emergency Medicine

## 2014-08-07 ENCOUNTER — Emergency Department (HOSPITAL_BASED_OUTPATIENT_CLINIC_OR_DEPARTMENT_OTHER): Payer: Medicare Other

## 2014-08-07 DIAGNOSIS — Z880 Allergy status to penicillin: Secondary | ICD-10-CM | POA: Insufficient documentation

## 2014-08-07 DIAGNOSIS — Z79899 Other long term (current) drug therapy: Secondary | ICD-10-CM | POA: Diagnosis not present

## 2014-08-07 DIAGNOSIS — E785 Hyperlipidemia, unspecified: Secondary | ICD-10-CM | POA: Diagnosis not present

## 2014-08-07 DIAGNOSIS — I10 Essential (primary) hypertension: Secondary | ICD-10-CM | POA: Diagnosis not present

## 2014-08-07 DIAGNOSIS — R079 Chest pain, unspecified: Secondary | ICD-10-CM | POA: Diagnosis present

## 2014-08-07 DIAGNOSIS — J45909 Unspecified asthma, uncomplicated: Secondary | ICD-10-CM | POA: Diagnosis not present

## 2014-08-07 DIAGNOSIS — Z79891 Long term (current) use of opiate analgesic: Secondary | ICD-10-CM | POA: Insufficient documentation

## 2014-08-07 DIAGNOSIS — Z7982 Long term (current) use of aspirin: Secondary | ICD-10-CM | POA: Insufficient documentation

## 2014-08-07 DIAGNOSIS — K219 Gastro-esophageal reflux disease without esophagitis: Secondary | ICD-10-CM | POA: Insufficient documentation

## 2014-08-07 DIAGNOSIS — R072 Precordial pain: Secondary | ICD-10-CM

## 2014-08-07 DIAGNOSIS — Z720 Tobacco use: Secondary | ICD-10-CM | POA: Insufficient documentation

## 2014-08-07 LAB — CBC WITH DIFFERENTIAL/PLATELET
Basophils Absolute: 0 10*3/uL (ref 0.0–0.1)
Basophils Relative: 0 % (ref 0–1)
EOS ABS: 0.1 10*3/uL (ref 0.0–0.7)
EOS PCT: 1 % (ref 0–5)
HCT: 38 % (ref 36.0–46.0)
Hemoglobin: 12.4 g/dL (ref 12.0–15.0)
LYMPHS ABS: 2.8 10*3/uL (ref 0.7–4.0)
Lymphocytes Relative: 42 % (ref 12–46)
MCH: 27.9 pg (ref 26.0–34.0)
MCHC: 32.6 g/dL (ref 30.0–36.0)
MCV: 85.6 fL (ref 78.0–100.0)
Monocytes Absolute: 0.5 10*3/uL (ref 0.1–1.0)
Monocytes Relative: 7 % (ref 3–12)
Neutro Abs: 3.4 10*3/uL (ref 1.7–7.7)
Neutrophils Relative %: 50 % (ref 43–77)
Platelets: 303 10*3/uL (ref 150–400)
RBC: 4.44 MIL/uL (ref 3.87–5.11)
RDW: 15.6 % — AB (ref 11.5–15.5)
WBC: 6.7 10*3/uL (ref 4.0–10.5)

## 2014-08-07 LAB — COMPREHENSIVE METABOLIC PANEL
ALBUMIN: 3.4 g/dL — AB (ref 3.5–5.2)
ALT: 12 U/L (ref 0–35)
ANION GAP: 12 (ref 5–15)
AST: 19 U/L (ref 0–37)
Alkaline Phosphatase: 64 U/L (ref 39–117)
BUN: 24 mg/dL — AB (ref 6–23)
CALCIUM: 9.2 mg/dL (ref 8.4–10.5)
CO2: 28 mEq/L (ref 19–32)
Chloride: 103 mEq/L (ref 96–112)
Creatinine, Ser: 1.1 mg/dL (ref 0.50–1.10)
GFR calc non Af Amer: 52 mL/min — ABNORMAL LOW (ref >90)
GFR, EST AFRICAN AMERICAN: 60 mL/min — AB (ref >90)
GLUCOSE: 95 mg/dL (ref 70–99)
Potassium: 3.8 mEq/L (ref 3.7–5.3)
SODIUM: 143 meq/L (ref 137–147)
Total Bilirubin: 0.6 mg/dL (ref 0.3–1.2)
Total Protein: 7 g/dL (ref 6.0–8.3)

## 2014-08-07 LAB — PRO B NATRIURETIC PEPTIDE: PRO B NATRI PEPTIDE: 53.8 pg/mL (ref 0–125)

## 2014-08-07 LAB — TROPONIN I: Troponin I: <0.3 ng/mL (ref <0.30)

## 2014-08-07 MED ORDER — ASPIRIN 81 MG PO CHEW
324.0000 mg | CHEWABLE_TABLET | Freq: Once | ORAL | Status: AC
  Administered 2014-08-07: 324 mg via ORAL
  Filled 2014-08-07: qty 4

## 2014-08-07 MED ORDER — NITROGLYCERIN 0.4 MG SL SUBL
0.4000 mg | SUBLINGUAL_TABLET | Freq: Once | SUBLINGUAL | Status: DC
  Filled 2014-08-07: qty 1

## 2014-08-07 NOTE — ED Notes (Signed)
[facility]

## 2014-08-07 NOTE — ED Notes (Signed)
Reports taking zyrtec and albuterol inhaler. Reports feeling nauseated afterwards and pain in her chest, with tingling in neck shoulder and down left arm.

## 2014-08-07 NOTE — ED Notes (Signed)
Beeped hospitalist again--will return call to dr. Blinda LeatherwoodPollina

## 2014-08-07 NOTE — ED Notes (Signed)
MD at bedside. 

## 2014-08-07 NOTE — ED Notes (Signed)
One drop in each eye of pts own med

## 2014-08-07 NOTE — ED Notes (Signed)
Called [facility]- will return call to [phone]

## 2014-08-07 NOTE — ED Provider Notes (Addendum)
CSN: [id number]     Arrival date & time 08/07/14  1534 History   First MD Initiated Contact with Patient 08/07/14 1540     Chief Complaint  Patient presents with  . Chest Pain     (Consider location/radiation/quality/duration/timing/severity/associated sxs/prior Treatment) HPI Comments: The patient presents to the ER for evaluation of chest pain. Patient reports that she had some mild shortness of breath earlier that she thought was related to her asthma. She took Zyrtec and albuterol inhaler. After that she developed pain in the left side of her chest which radiated to the left side of her neck and arm. Pain was still present upon arrival to the ER. She does not have any history of heart disease. She does have a history of diabetes and hypertension. She also has a smoking history. This is not short of breath any longer. There is no nausea or diaphoresis.  Patient is a 68 y.o. female presenting with chest pain.  Chest Pain Associated symptoms: shortness of breath     Past Medical History  Diagnosis Date  . Hypertension   . Vertigo   . Hyperlipidemia   . Pinched nerve in neck     neck and shoulder  . Asthma   . Reflux   . Hiatal hernia   . Pre-diabetes    Past Surgical History  Procedure Laterality Date  . Abdominal hysterectomy     No family history on file. History  Substance Use Topics  . Smoking status: Former Smoker -- 0.50 packs/day    Quit date: 12/21/2012  . Smokeless tobacco: Never Used  . Alcohol Use: No   OB History   Grav Para Term Preterm Abortions TAB SAB Ect Mult Living                 Review of Systems  Respiratory: Positive for shortness of breath.   Cardiovascular: Positive for chest pain.  All other systems reviewed and are negative.     Allergies  Codeine; Iodine; Penicillins; Prednisone; Sulfa antibiotics; and Vicodin  Home Medications   Prior to Admission medications   Medication Sig Start Date End Date Taking? Authorizing Provider   albuterol (PROVENTIL) (2.5 MG/3ML) 0.083% nebulizer solution Take 2.5 mg by nebulization every 6 (six) hours as needed for wheezing or shortness of breath.    Historical Provider, MD  ALPRAZolam Prudy Feeler) 0.25 MG tablet Take 1 tablet (0.25 mg total) by mouth 3 (three) times daily as needed for anxiety. 03/08/13   Carlisle Beers Molpus, MD  aspirin 81 MG tablet Take 81 mg by mouth daily.    Historical Provider, MD  azithromycin (ZITHROMAX) 250 MG tablet Take 1 tablet (250 mg total) by mouth daily. Take first 2 tablets together, then 1 every day until finished. 10/18/13   Elson Areas, PA-C  beclomethasone (QVAR) 40 MCG/ACT inhaler Inhale 2 puffs into the lungs 2 (two) times daily.    Historical Provider, MD  cholecalciferol (VITAMIN D) 1000 UNITS tablet Take 1,000 Units by mouth daily.    Historical Provider, MD  doxycycline (VIBRAMYCIN) 100 MG capsule Take 1 capsule (100 mg total) by mouth 2 (two) times daily. One po bid x 7 days 04/01/14   Enid Skeens, MD  esomeprazole (NEXIUM) 40 MG capsule Take 40 mg by mouth daily before breakfast.    Historical Provider, MD  HYDROcodone-acetaminophen (NORCO/VICODIN) 5-325 MG per tablet Take 2 tablets by mouth every 4 (four) hours as needed. 10/30/13   Elson Areas, PA-C  lisinopril (PRINIVIL,ZESTRIL)  10 MG tablet Take 10 mg by mouth daily.    Historical Provider, MD  meclizine (ANTIVERT) 25 MG tablet Take 25 mg by mouth 3 (three) times daily as needed.    Historical Provider, MD  metFORMIN (GLUCOPHAGE) 500 MG tablet Take 500 mg by mouth. Half tab with meal at evening    Historical Provider, MD  montelukast (SINGULAIR) 10 MG tablet Take 10 mg by mouth as needed.    Historical Provider, MD  NASONEX 50 MCG/ACT nasal spray  09/30/13   Historical Provider, MD  ondansetron (ZOFRAN ODT) 4 MG disintegrating tablet 4mg  ODT q4 hours prn nausea/vomit 04/01/14   Enid Skeens, MD  oxyCODONE-acetaminophen (PERCOCET/ROXICET) 5-325 MG per tablet Take 1 tablet by mouth every 8  (eight) hours as needed for severe pain. 06/04/14   Renne Crigler, PA-C  pravastatin (PRAVACHOL) 20 MG tablet Take 20 mg by mouth daily.    Historical Provider, MD   BP 149/80  Pulse 69  Temp(Src) 98.4 F (36.9 C) (Oral)  Resp 18  Ht 5\' 2"  (1.575 m)  Wt 179 lb (81.194 kg)  BMI 32.73 kg/m2  SpO2 100% Physical Exam  Constitutional: She is oriented to person, place, and time. She appears well-developed and well-nourished. No distress.  HENT:  Head: Normocephalic and atraumatic.  Right Ear: Hearing normal.  Left Ear: Hearing normal.  Nose: Nose normal.  Mouth/Throat: Oropharynx is clear and moist and mucous membranes are normal.  Eyes: Conjunctivae and EOM are normal. Pupils are equal, round, and reactive to light.  Neck: Normal range of motion. Neck supple.  Cardiovascular: Regular rhythm, S1 normal and S2 normal.  Exam reveals no gallop and no friction rub.   No murmur heard. Pulmonary/Chest: Effort normal and breath sounds normal. No respiratory distress. She exhibits no tenderness.  Abdominal: Soft. Normal appearance and bowel sounds are normal. There is no hepatosplenomegaly. There is no tenderness. There is no rebound, no guarding, no tenderness at McBurney's point and negative Murphy's sign. No hernia.  Musculoskeletal: Normal range of motion.  Neurological: She is alert and oriented to person, place, and time. She has normal strength. No cranial nerve deficit or sensory deficit. Coordination normal. GCS eye subscore is 4. GCS verbal subscore is 5. GCS motor subscore is 6.  Skin: Skin is warm, dry and intact. No rash noted. No cyanosis.  Psychiatric: She has a normal mood and affect. Her speech is normal and behavior is normal. Thought content normal.    ED Course  Procedures (including critical care time) Labs Review Labs Reviewed  CBC WITH DIFFERENTIAL - Abnormal; Notable for the following:    RDW 15.6 (*)    All other components within normal limits  COMPREHENSIVE METABOLIC  PANEL - Abnormal; Notable for the following:    BUN 24 (*)    Albumin 3.4 (*)    GFR calc non Af Amer 52 (*)    GFR calc Af Amer 60 (*)    All other components within normal limits  TROPONIN I  PRO B NATRIURETIC PEPTIDE    Imaging Review Dg Chest 2 View  08/07/2014   CLINICAL DATA:  Chest pain and shortness of breath. Ex-smoker. Initial encounter.  EXAM: CHEST  2 VIEW  COMPARISON:  04/10/2014  FINDINGS: Midline trachea. Normal heart size and mediastinal contours.Sharp costophrenic angles. No pneumothorax. Mild left base subsegmental atelectasis.  IMPRESSION: No active cardiopulmonary disease.   Electronically Signed   By: Jeronimo Greaves M.D.   On: 08/07/2014 17:14  EKG Interpretation   Date/Time:  Thursday August 07 2014 15:41:43 EDT Ventricular Rate:  76 PR Interval:  144 QRS Duration: 92 QT Interval:  360 QTC Calculation: 405 R Axis:   -23 Text Interpretation:  Normal sinus rhythm Possible Left atrial enlargement  Low voltage QRS Septal infarct , age undetermined Abnormal ECG No  significant change since last tracing Confirmed by POLLINA  MD,  CHRISTOPHER [phone]([id number]) on 08/07/2014 3:44:24 PM      MDM   Final diagnoses:  None  Chest Pain  Patient presents to the ER for evaluation of chest pain. Patient had onset of left-sided chest pain radiating to the neck and arm prior to arrival in the ER. Her EKG was unremarkable her arrival. Patient was administered aspirin and nitroglycerin, pain has completely resolved. Patient's first troponin is negative. Based on her age and cardiac risk factors, I recommend hospitalization for further management. [facility]. Discussed with Doctor Pasupala, hospitalist physician and patient has been accepted for transfer.    Gilda Creasehristopher J. Pollina, MD 08/07/14 1725  Gilda Creasehristopher J. Pollina, MD 08/07/14 [phone]

## 2014-09-11 ENCOUNTER — Ambulatory Visit (INDEPENDENT_AMBULATORY_CARE_PROVIDER_SITE_OTHER): Payer: Medicare Other | Admitting: Pulmonary Disease

## 2014-09-11 ENCOUNTER — Encounter: Payer: Self-pay | Admitting: Pulmonary Disease

## 2014-09-11 VITALS — BP 132/70 | HR 80 | Ht 60.0 in | Wt 177.0 lb

## 2014-09-11 DIAGNOSIS — J4530 Mild persistent asthma, uncomplicated: Secondary | ICD-10-CM | POA: Diagnosis not present

## 2014-09-11 DIAGNOSIS — J45909 Unspecified asthma, uncomplicated: Secondary | ICD-10-CM | POA: Insufficient documentation

## 2014-09-11 DIAGNOSIS — R0602 Shortness of breath: Secondary | ICD-10-CM

## 2014-09-11 MED ORDER — LOSARTAN POTASSIUM 50 MG PO TABS: 50.0000 mg | ORAL_TABLET | Freq: Every day | ORAL | Status: DC

## 2014-09-11 NOTE — Assessment & Plan Note (Signed)
I do not think that she has significant COPD Her spirometry in fact shows restriction rather than airway obstruction. It is possible that she has adult onset asthma-but I would only believe this if I saw evidence of variable lung function. As such it is reasonable to keep her on Qvar for now. She will use albuterol only as needed She will call if symptoms worse or if she has attacks of bronchitis.

## 2014-09-11 NOTE — Progress Notes (Signed)
Subjective:    Patient ID: Donna Frank, female    DOB: [birth year], 68 y.o.   MRN: [id number]  HPI PCP - jobe (premier) Ref- Bardelas  68 year old ex-smoker, presents for evaluation of COPD She smoked half pack per day for about 40 years-about 20 pack years before quitting in 2014. She can walk about half a mile before getting out of breath. She was diagnosed with asthma about 2 years ago and was placed on Qvar and Proventil. She feels that Proventil causes a burning sensation in her throat. She is maintained on list nocturnal for hypertension, and she complains of a dry cough which is a  Nuisance. Her heartburn is well controlled on Nexium. She had a recent emergency room visit for chest pain and was transferred to [facility]-but extensive cardiac evaluation was negative. She has a pinched nerve in her neck for which she is seeing a neurosurgeon and undergoing physical therapy, she would like to avoid surgery if possible. ENT evaluation for headache also pointed to this pinched nerve as a cause. She denies wheezing or frequent chest colds. There is no history of orthopnea, paroxysmal nocturnal dyspnea or pedal edema Spirometry 08/2014 did not show airway obstruction, mild restriction with FVC 75%, ratio 78    Past Medical History  Diagnosis Date  . Hypertension   . Vertigo   . Hyperlipidemia   . Pinched nerve in neck     neck and shoulder  . Asthma   . Reflux   . Hiatal hernia   . Pre-diabetes     Past Surgical History  Procedure Laterality Date  . Abdominal hysterectomy      Allergies  Allergen Reactions  . Codeine   . Iodine Itching  . Penicillins Nausea Only  . Prednisone Nausea And Vomiting    Only allergic to pills,  Can take injection ok.   . Sulfa Antibiotics   . Vicodin [Hydrocodone-Acetaminophen] Nausea And Vomiting    "I don't want none of that"     Current outpatient prescriptions: albuterol (PROVENTIL) (2.5 MG/3ML) 0.083% nebulizer  solution, Take 2.5 mg by nebulization every 6 (six) hours as needed for wheezing or shortness of breath., Disp: , Rfl: ;  ALPRAZolam (XANAX) 0.25 MG tablet, Take 1 tablet (0.25 mg total) by mouth 3 (three) times daily as needed for anxiety., Disp: 20 tablet, Rfl: 0;  aspirin 81 MG tablet, Take 81 mg by mouth daily., Disp: , Rfl:  beclomethasone (QVAR) 40 MCG/ACT inhaler, Inhale 2 puffs into the lungs 2 (two) times daily., Disp: , Rfl: ;  cetirizine (ZYRTEC) 10 MG tablet, Take 10 mg by mouth daily., Disp: , Rfl: ;  cholecalciferol (VITAMIN D) 1000 UNITS tablet, Take 1,000 Units by mouth daily., Disp: , Rfl: ;  esomeprazole (NEXIUM) 40 MG capsule, Take 40 mg by mouth daily before breakfast., Disp: , Rfl:  lisinopril (PRINIVIL,ZESTRIL) 10 MG tablet, Take 30 mg by mouth daily. , Disp: , Rfl: ;  meclizine (ANTIVERT) 25 MG tablet, Take 25 mg by mouth 3 (three) times daily as needed., Disp: , Rfl: ;  metFORMIN (GLUCOPHAGE) 500 MG tablet, Take 500 mg by mouth. Half tab with meal at evening, Disp: , Rfl: ;  montelukast (SINGULAIR) 10 MG tablet, Take 10 mg by mouth as needed., Disp: , Rfl:  pravastatin (PRAVACHOL) 20 MG tablet, Take 40 mg by mouth daily. , Disp: , Rfl:    Family History  Problem Relation Age of Onset  . Emphysema Maternal Grandfather   .  COPD Mother   . Heart disease Maternal Uncle   . Heart disease Maternal Aunt   . Rheum arthritis Sister     History   Social History  . Marital Status: Divorced    Spouse Name: N/A    Number of Children: N/A  . Years of Education: N/A   Occupational History  . Not on file.   Social History Main Topics  . Smoking status: Former Smoker -- 0.50 packs/day for 35 years    Quit date: 12/21/2012  . Smokeless tobacco: Never Used  . Alcohol Use: No  . Drug Use: No  . Sexual Activity: Not on file   Other Topics Concern  . Not on file   Social History Narrative     Review of Systems  Constitutional: Negative for fever and unexpected weight  change.  HENT: Positive for postnasal drip, sinus pressure and trouble swallowing. Negative for congestion, dental problem, ear pain, nosebleeds, rhinorrhea, sneezing and sore throat.   Eyes: Negative for redness and itching.  Respiratory: Positive for cough, chest tightness, shortness of breath and wheezing.   Cardiovascular: Negative for palpitations and leg swelling.  Gastrointestinal: Negative for nausea and vomiting.  Genitourinary: Negative for dysuria.  Musculoskeletal: Negative for joint swelling.  Skin: Negative for rash.  Neurological: Negative for headaches.  Hematological: Does not bruise/bleed easily.  Psychiatric/Behavioral: Negative for dysphoric mood. The patient is not nervous/anxious.        Objective:   Physical Exam  Gen. Pleasant, well-nourished, in no distress, normal affect ENT - no lesions, no post nasal drip Neck: No JVD, no thyromegaly, no carotid bruits Lungs: no use of accessory muscles, no dullness to percussion, clear without rales or rhonchi  Cardiovascular: Rhythm regular, heart sounds  normal, no murmurs or gallops, no peripheral edema Abdomen: soft and non-tender, no hepatosplenomegaly, BS normal. Musculoskeletal: No deformities, no cyanosis or clubbing Neuro:  alert, non focal       Assessment & Plan:

## 2014-09-11 NOTE — Patient Instructions (Signed)
STOP lisinopril Use losartan 50 mg instead Stay on qvar Use albuterol 2 puffs as needed

## 2014-09-29 ENCOUNTER — Telehealth: Payer: Self-pay | Admitting: Pulmonary Disease

## 2014-09-29 NOTE — Telephone Encounter (Signed)
Reviewed spirometry 07/2014 -  Restriction,ratio 81, FEV1 67%

## 2014-11-04 ENCOUNTER — Encounter (HOSPITAL_BASED_OUTPATIENT_CLINIC_OR_DEPARTMENT_OTHER): Payer: Self-pay | Admitting: Emergency Medicine

## 2014-11-04 ENCOUNTER — Emergency Department (HOSPITAL_BASED_OUTPATIENT_CLINIC_OR_DEPARTMENT_OTHER)
Admission: EM | Admit: 2014-11-04 | Discharge: 2014-11-04 | Disposition: A | Discharge status: Home / Self Care | Payer: Medicare Other | Attending: Emergency Medicine | Admitting: Emergency Medicine

## 2014-11-04 DIAGNOSIS — J45901 Unspecified asthma with (acute) exacerbation: Secondary | ICD-10-CM | POA: Diagnosis not present

## 2014-11-04 DIAGNOSIS — Z8669 Personal history of other diseases of the nervous system and sense organs: Secondary | ICD-10-CM | POA: Diagnosis not present

## 2014-11-04 DIAGNOSIS — Y929 Unspecified place or not applicable: Secondary | ICD-10-CM | POA: Diagnosis not present

## 2014-11-04 DIAGNOSIS — S46812A Strain of other muscles, fascia and tendons at shoulder and upper arm level, left arm, initial encounter: Secondary | ICD-10-CM | POA: Diagnosis not present

## 2014-11-04 DIAGNOSIS — Z79899 Other long term (current) drug therapy: Secondary | ICD-10-CM | POA: Diagnosis not present

## 2014-11-04 DIAGNOSIS — Z880 Allergy status to penicillin: Secondary | ICD-10-CM | POA: Diagnosis not present

## 2014-11-04 DIAGNOSIS — I10 Essential (primary) hypertension: Secondary | ICD-10-CM | POA: Insufficient documentation

## 2014-11-04 DIAGNOSIS — X58XXXA Exposure to other specified factors, initial encounter: Secondary | ICD-10-CM | POA: Insufficient documentation

## 2014-11-04 DIAGNOSIS — M542 Cervicalgia: Secondary | ICD-10-CM | POA: Diagnosis present

## 2014-11-04 DIAGNOSIS — Y999 Unspecified external cause status: Secondary | ICD-10-CM | POA: Insufficient documentation

## 2014-11-04 DIAGNOSIS — Z7982 Long term (current) use of aspirin: Secondary | ICD-10-CM | POA: Insufficient documentation

## 2014-11-04 DIAGNOSIS — K219 Gastro-esophageal reflux disease without esophagitis: Secondary | ICD-10-CM | POA: Diagnosis not present

## 2014-11-04 DIAGNOSIS — E785 Hyperlipidemia, unspecified: Secondary | ICD-10-CM | POA: Diagnosis not present

## 2014-11-04 DIAGNOSIS — Z7951 Long term (current) use of inhaled steroids: Secondary | ICD-10-CM | POA: Insufficient documentation

## 2014-11-04 DIAGNOSIS — Z87891 Personal history of nicotine dependence: Secondary | ICD-10-CM | POA: Diagnosis not present

## 2014-11-04 DIAGNOSIS — Y939 Activity, unspecified: Secondary | ICD-10-CM | POA: Insufficient documentation

## 2014-11-04 MED ORDER — METHOCARBAMOL 500 MG PO TABS: 500.0000 mg | ORAL_TABLET | Freq: Three times a day (TID) | ORAL | Status: DC | PRN

## 2014-11-04 MED ORDER — IBUPROFEN 600 MG PO TABS: 600.0000 mg | ORAL_TABLET | Freq: Three times a day (TID) | ORAL | Status: DC | PRN

## 2014-11-04 MED ORDER — DIAZEPAM 5 MG PO TABS
5.0000 mg | ORAL_TABLET | Freq: Once | ORAL | Status: AC
  Administered 2014-11-04: 5 mg via ORAL
  Filled 2014-11-04: qty 1

## 2014-11-04 MED ORDER — KETOROLAC TROMETHAMINE 60 MG/2ML IM SOLN
60.0000 mg | Freq: Once | INTRAMUSCULAR | Status: AC
  Administered 2014-11-04: 60 mg via INTRAMUSCULAR
  Filled 2014-11-04: qty 2

## 2014-11-04 MED ORDER — OXYCODONE HCL 5 MG PO TABS
5.0000 mg | ORAL_TABLET | Freq: Once | ORAL | Status: AC
  Administered 2014-11-04: 5 mg via ORAL
  Filled 2014-11-04: qty 1

## 2014-11-04 NOTE — ED Provider Notes (Signed)
CSN: [id number]     Arrival date & time 11/04/14  1327 History   First MD Initiated Contact with Patient 11/04/14 1341     Chief Complaint  Patient presents with  . Neck Pain  . Shortness of Breath     HPI Patient presents to the emergency department with ongoing left trapezius and left-sided neck pain.  She states this morning her pain became severe and made her nauseated.  She denies weakness in her arms or legs.  She denies recent injury or trauma.  Patient has a history of similar symptoms before in the past.  Pain is mild to moderate in severity.    Past Medical History  Diagnosis Date  . Hypertension   . Vertigo   . Hyperlipidemia   . Pinched nerve in neck     neck and shoulder  . Asthma   . Reflux   . Hiatal hernia   . Pre-diabetes    Past Surgical History  Procedure Laterality Date  . Abdominal hysterectomy     Family History  Problem Relation Age of Onset  . Emphysema Maternal Grandfather   . COPD Mother   . Heart disease Maternal Uncle   . Heart disease Maternal Aunt   . Rheum arthritis Sister    History  Substance Use Topics  . Smoking status: Former Smoker -- 0.50 packs/day for 35 years    Quit date: 12/21/2012  . Smokeless tobacco: Never Used  . Alcohol Use: No   OB History    No data available     Review of Systems  All other systems reviewed and are negative.     Allergies  Codeine; Iodine; Penicillins; Prednisone; Sulfa antibiotics; and Vicodin  Home Medications   Prior to Admission medications   Medication Sig Start Date End Date Taking? Authorizing Provider  albuterol (PROVENTIL) (2.5 MG/3ML) 0.083% nebulizer solution Take 2.5 mg by nebulization every 6 (six) hours as needed for wheezing or shortness of breath.    Historical Provider, MD  ALPRAZolam Prudy Feeler(XANAX) 0.25 MG tablet Take 1 tablet (0.25 mg total) by mouth 3 (three) times daily as needed for anxiety. 03/08/13   Carlisle BeersJohn L Molpus, MD  aspirin 81 MG tablet Take 81 mg by mouth daily.     Historical Provider, MD  beclomethasone (QVAR) 40 MCG/ACT inhaler Inhale 2 puffs into the lungs 2 (two) times daily.    Historical Provider, MD  cetirizine (ZYRTEC) 10 MG tablet Take 10 mg by mouth daily.    Historical Provider, MD  cholecalciferol (VITAMIN D) 1000 UNITS tablet Take 1,000 Units by mouth daily.    Historical Provider, MD  esomeprazole (NEXIUM) 40 MG capsule Take 40 mg by mouth daily before breakfast.    Historical Provider, MD  ibuprofen (ADVIL,MOTRIN) 600 MG tablet Take 1 tablet (600 mg total) by mouth every 8 (eight) hours as needed. 11/04/14   Lyanne CoKevin M Sherlynn Tourville, MD  lisinopril (PRINIVIL,ZESTRIL) 10 MG tablet Take 30 mg by mouth daily.     Historical Provider, MD  losartan (COZAAR) 50 MG tablet Take 1 tablet (50 mg total) by mouth daily. 09/11/14   Oretha Milchakesh Alva V, MD  meclizine (ANTIVERT) 25 MG tablet Take 25 mg by mouth 3 (three) times daily as needed.    Historical Provider, MD  metFORMIN (GLUCOPHAGE) 500 MG tablet Take 500 mg by mouth. Half tab with meal at evening    Historical Provider, MD  methocarbamol (ROBAXIN) 500 MG tablet Take 1 tablet (500 mg total) by mouth every 8 (  eight) hours as needed for muscle spasms. 11/04/14   Lyanne Co, MD  montelukast (SINGULAIR) 10 MG tablet Take 10 mg by mouth as needed.    Historical Provider, MD  pravastatin (PRAVACHOL) 20 MG tablet Take 40 mg by mouth daily.     Historical Provider, MD   BP 142/69 mmHg  Pulse 72  Temp(Src) 97.9 F (36.6 C) (Oral)  Resp 16  Ht  (1.575 m)  Wt 177 lb (80.287 kg)  BMI 32.37 kg/m2  SpO2 99% Physical Exam  Constitutional: She is oriented to person, place, and time. She appears well-developed and well-nourished. No distress.  HENT:  Head: Normocephalic and atraumatic.  Eyes: EOM are normal.  Neck: Normal range of motion.  Cardiovascular: Normal rate, regular rhythm and normal heart sounds.   Pulmonary/Chest: Effort normal and breath sounds normal.  Abdominal: Soft. She exhibits no distension.  There is no tenderness.  Musculoskeletal: Normal range of motion.  Normal left radial pulse.  Normal strength in her bilateral hands.  Mild tenderness over left trapezius with spasm.  This exacerbates her pain with palpation of the left trapezius.  No rash noted.  Neurological: She is alert and oriented to person, place, and time.  Skin: Skin is warm and dry.  Psychiatric: She has a normal mood and affect. Judgment normal.  Nursing note and vitals reviewed.   ED Course  Procedures (including critical care time) Labs Review Labs Reviewed - No data to display  Imaging Review No results found.   EKG Interpretation   Date/Time:  Tuesday November 04 2014 14:14:23 EST Ventricular Rate:  67 PR Interval:  150 QRS Duration: 86 QT Interval:  388 QTC Calculation: 409 R Axis:   119 Text Interpretation:  Normal sinus rhythm with sinus arrhythmia Right axis  deviation Cannot rule out Anterior infarct , age undetermined Abnormal ECG  No significant change was found Confirmed by Giovanny Dugal  MD, Caryn Bee ([id number]) on  11/04/2014 2:30:12 PM      MDM   Final diagnoses:  Trapezius strain, left, initial encounter    Suspect musculoskeletal type pain.  This seems to be spasm in the left trapezius muscle.  Pain treated.  Home with muscle relaxants and anti-inflammatories.    Lyanne Co, MD 11/04/14 [phone]

## 2014-11-04 NOTE — ED Notes (Signed)
69 yo with c/o pinched nerve in the left neck causing mild sob due to pain. This morning got nauseated and felt weak. Currently in NAD. Pain 6/10. Ambulatory and a/o x4

## 2014-12-20 ENCOUNTER — Encounter (HOSPITAL_BASED_OUTPATIENT_CLINIC_OR_DEPARTMENT_OTHER): Payer: Self-pay | Admitting: *Deleted

## 2014-12-20 ENCOUNTER — Emergency Department (HOSPITAL_BASED_OUTPATIENT_CLINIC_OR_DEPARTMENT_OTHER): Payer: Medicare Other

## 2014-12-20 ENCOUNTER — Emergency Department (HOSPITAL_BASED_OUTPATIENT_CLINIC_OR_DEPARTMENT_OTHER)
Admission: EM | Admit: 2014-12-20 | Discharge: 2014-12-20 | Disposition: A | Discharge status: Home / Self Care | Payer: Medicare Other | Attending: Emergency Medicine | Admitting: Emergency Medicine

## 2014-12-20 DIAGNOSIS — E785 Hyperlipidemia, unspecified: Secondary | ICD-10-CM | POA: Insufficient documentation

## 2014-12-20 DIAGNOSIS — R51 Headache: Secondary | ICD-10-CM | POA: Diagnosis not present

## 2014-12-20 DIAGNOSIS — J45909 Unspecified asthma, uncomplicated: Secondary | ICD-10-CM | POA: Insufficient documentation

## 2014-12-20 DIAGNOSIS — Z87891 Personal history of nicotine dependence: Secondary | ICD-10-CM | POA: Diagnosis not present

## 2014-12-20 DIAGNOSIS — I10 Essential (primary) hypertension: Secondary | ICD-10-CM | POA: Diagnosis not present

## 2014-12-20 DIAGNOSIS — R42 Dizziness and giddiness: Secondary | ICD-10-CM | POA: Insufficient documentation

## 2014-12-20 DIAGNOSIS — Z7982 Long term (current) use of aspirin: Secondary | ICD-10-CM | POA: Diagnosis not present

## 2014-12-20 DIAGNOSIS — Z79899 Other long term (current) drug therapy: Secondary | ICD-10-CM | POA: Insufficient documentation

## 2014-12-20 DIAGNOSIS — Z8669 Personal history of other diseases of the nervous system and sense organs: Secondary | ICD-10-CM | POA: Insufficient documentation

## 2014-12-20 DIAGNOSIS — M6281 Muscle weakness (generalized): Secondary | ICD-10-CM | POA: Diagnosis not present

## 2014-12-20 DIAGNOSIS — M629 Disorder of muscle, unspecified: Secondary | ICD-10-CM | POA: Insufficient documentation

## 2014-12-20 DIAGNOSIS — Z880 Allergy status to penicillin: Secondary | ICD-10-CM | POA: Diagnosis not present

## 2014-12-20 DIAGNOSIS — G8929 Other chronic pain: Secondary | ICD-10-CM | POA: Insufficient documentation

## 2014-12-20 DIAGNOSIS — K219 Gastro-esophageal reflux disease without esophagitis: Secondary | ICD-10-CM | POA: Insufficient documentation

## 2014-12-20 DIAGNOSIS — Z7951 Long term (current) use of inhaled steroids: Secondary | ICD-10-CM | POA: Diagnosis not present

## 2014-12-20 LAB — CBC WITH DIFFERENTIAL/PLATELET
Basophils Absolute: 0 K/uL (ref 0.0–0.1)
Basophils Relative: 0 % (ref 0–1)
Eosinophils Absolute: 0.1 K/uL (ref 0.0–0.7)
Eosinophils Relative: 2 % (ref 0–5)
HCT: 40 % (ref 36.0–46.0)
Hemoglobin: 12.8 g/dL (ref 12.0–15.0)
Lymphocytes Relative: 59 % — ABNORMAL HIGH (ref 12–46)
Lymphs Abs: 3.8 K/uL (ref 0.7–4.0)
MCH: 26.7 pg (ref 26.0–34.0)
MCHC: 32 g/dL (ref 30.0–36.0)
MCV: 83.3 fL (ref 78.0–100.0)
Monocytes Absolute: 0.5 K/uL (ref 0.1–1.0)
Monocytes Relative: 7 % (ref 3–12)
Neutro Abs: 2.1 K/uL (ref 1.7–7.7)
Neutrophils Relative %: 32 % — ABNORMAL LOW (ref 43–77)
Platelets: 313 K/uL (ref 150–400)
RBC: 4.8 MIL/uL (ref 3.87–5.11)
RDW: 14.9 % (ref 11.5–15.5)
WBC: 6.5 K/uL (ref 4.0–10.5)

## 2014-12-20 LAB — URINALYSIS, ROUTINE W REFLEX MICROSCOPIC
Bilirubin Urine: NEGATIVE
Glucose, UA: NEGATIVE mg/dL
Hgb urine dipstick: NEGATIVE
Ketones, ur: NEGATIVE mg/dL
Nitrite: NEGATIVE
Protein, ur: NEGATIVE mg/dL
Specific Gravity, Urine: 1.007 (ref 1.005–1.030)
Urobilinogen, UA: 0.2 mg/dL (ref 0.0–1.0)
pH: 7 (ref 5.0–8.0)

## 2014-12-20 LAB — RAPID URINE DRUG SCREEN, HOSP PERFORMED
Amphetamines: NOT DETECTED
Barbiturates: NOT DETECTED
Benzodiazepines: NOT DETECTED
Cocaine: NOT DETECTED
Opiates: NOT DETECTED
Tetrahydrocannabinol: NOT DETECTED

## 2014-12-20 LAB — APTT: aPTT: 34 s (ref 24–37)

## 2014-12-20 LAB — COMPREHENSIVE METABOLIC PANEL
ALBUMIN: 3.8 g/dL (ref 3.5–5.2)
ALK PHOS: 67 U/L (ref 39–117)
ALT: 16 U/L (ref 0–35)
AST: 21 U/L (ref 0–37)
Anion gap: 4 — ABNORMAL LOW (ref 5–15)
BUN: 20 mg/dL (ref 6–23)
CHLORIDE: 107 mmol/L (ref 96–112)
CO2: 28 mmol/L (ref 19–32)
CREATININE: 0.77 mg/dL (ref 0.50–1.10)
Calcium: 9 mg/dL (ref 8.4–10.5)
GFR calc Af Amer: >90 mL/min (ref >90)
GFR calc non Af Amer: 86 mL/min — ABNORMAL LOW (ref >90)
GLUCOSE: 85 mg/dL (ref 70–99)
POTASSIUM: 4.3 mmol/L (ref 3.5–5.1)
SODIUM: 139 mmol/L (ref 135–145)
Total Bilirubin: 0.6 mg/dL (ref 0.3–1.2)
Total Protein: 6.6 g/dL (ref 6.0–8.3)

## 2014-12-20 LAB — ETHANOL

## 2014-12-20 LAB — TROPONIN I: Troponin I: <0.03 ng/mL (ref <0.031)

## 2014-12-20 LAB — URINE MICROSCOPIC-ADD ON

## 2014-12-20 LAB — CBG MONITORING, ED: Glucose-Capillary: 68 mg/dL — ABNORMAL LOW (ref 70–99)

## 2014-12-20 MED ORDER — SODIUM CHLORIDE 0.9 % IV BOLUS (SEPSIS)
500.0000 mL | Freq: Once | INTRAVENOUS | Status: AC
  Administered 2014-12-20: 500 mL via INTRAVENOUS

## 2014-12-20 MED ORDER — FENTANYL CITRATE 0.05 MG/ML IJ SOLN
25.0000 ug | Freq: Once | INTRAMUSCULAR | Status: AC
  Administered 2014-12-20: 25 ug via INTRAVENOUS
  Filled 2014-12-20: qty 2

## 2014-12-20 MED ORDER — SODIUM CHLORIDE 0.9 % IV SOLN: 100.0000 mL/h | INTRAVENOUS | Status: DC

## 2014-12-20 MED ORDER — ASPIRIN 81 MG PO CHEW
324.0000 mg | CHEWABLE_TABLET | Freq: Every day | ORAL | Status: DC
Start: 2014-12-20 — End: 2021-04-26

## 2014-12-20 NOTE — ED Notes (Signed)
Pt here with dizziness and left arm weakness.  Pt reports that "I havent been normal in a long time" but she reports that she noticed definite worsening of symptoms yesterday at 3pm when picking her grandson up from school.  No changes in speech, pt is alert adn oriented.  Per family no facial asymetry when compared to baseline

## 2014-12-20 NOTE — ED Provider Notes (Signed)
CSN: [id number]     Arrival date & time 12/20/14  1255 History   First MD Initiated Contact with Patient 12/20/14 1308     Chief Complaint  Patient presents with  . Dizziness    HPI Patient presents with family members to assist with the history of present illness. Patient complains of dizziness. Time of onset is unclear, though it seems as though her symptoms have become more present since yesterday. At some point after the onset of worsening dizziness patient also developed possible left arm weakness, though this seems inconsistent, and the patient also complains of occasional left leg pain. Patient denies any speech changes, changes in cognition, visual loss, facial asymmetry aphasia.  Patient has history of vertigo, chronic pain, is currently having her care managed by a pain management specialist in [facility]. Patient is scheduled for MRI of her cervical spine this week. There is no clear precipitant prior to the onset of symptoms yesterday, and since onset symptoms have been minimally better with rest, worse with motion.   Past Medical History  Diagnosis Date  . Hypertension   . Vertigo   . Hyperlipidemia   . Pinched nerve in neck     neck and shoulder  . Asthma   . Reflux   . Hiatal hernia   . Pre-diabetes    Past Surgical History  Procedure Laterality Date  . Abdominal hysterectomy     Family History  Problem Relation Age of Onset  . Emphysema Maternal Grandfather   . COPD Mother   . Heart disease Maternal Uncle   . Heart disease Maternal Aunt   . Rheum arthritis Sister    History  Substance Use Topics  . Smoking status: Former Smoker -- 0.50 packs/day for 35 years    Quit date: 12/21/2012  . Smokeless tobacco: Never Used  . Alcohol Use: No   OB History    No data available     Review of Systems  Constitutional:       Per HPI, otherwise negative  HENT:       Per HPI, otherwise negative  Respiratory:       Per HPI, otherwise negative   Cardiovascular:       Per HPI, otherwise negative  Gastrointestinal: Negative for vomiting.  Endocrine:       Negative aside from HPI  Genitourinary:       Neg aside from HPI   Musculoskeletal:       Per HPI, otherwise negative  Skin: Negative.   Neurological: Positive for dizziness, weakness, light-headedness and headaches. Negative for seizures, syncope, facial asymmetry and numbness.      Allergies  Codeine; Iodine; Penicillins; Prednisone; Sulfa antibiotics; and Vicodin  Home Medications   Prior to Admission medications   Medication Sig Start Date End Date Taking? Authorizing Provider  albuterol (PROVENTIL) (2.5 MG/3ML) 0.083% nebulizer solution Take 2.5 mg by nebulization every 6 (six) hours as needed for wheezing or shortness of breath.    Historical Provider, MD  ALPRAZolam Prudy Feeler) 0.25 MG tablet Take 1 tablet (0.25 mg total) by mouth 3 (three) times daily as needed for anxiety. 03/08/13   Carlisle Beers Molpus, MD  aspirin 81 MG tablet Take 81 mg by mouth daily.    Historical Provider, MD  beclomethasone (QVAR) 40 MCG/ACT inhaler Inhale 2 puffs into the lungs 2 (two) times daily.    Historical Provider, MD  cetirizine (ZYRTEC) 10 MG tablet Take 10 mg by mouth daily.    Historical Provider, MD  cholecalciferol (VITAMIN D) 1000 UNITS tablet Take 1,000 Units by mouth daily.    Historical Provider, MD  esomeprazole (NEXIUM) 40 MG capsule Take 40 mg by mouth daily before breakfast.    Historical Provider, MD  ibuprofen (ADVIL,MOTRIN) 600 MG tablet Take 1 tablet (600 mg total) by mouth every 8 (eight) hours as needed. 11/04/14   Lyanne Co, MD  lisinopril (PRINIVIL,ZESTRIL) 10 MG tablet Take 30 mg by mouth daily.     Historical Provider, MD  losartan (COZAAR) 50 MG tablet Take 1 tablet (50 mg total) by mouth daily. 09/11/14   Oretha Milch, MD  meclizine (ANTIVERT) 25 MG tablet Take 25 mg by mouth 3 (three) times daily as needed.    Historical Provider, MD  metFORMIN (GLUCOPHAGE) 500 MG  tablet Take 500 mg by mouth. Half tab with meal at evening    Historical Provider, MD  methocarbamol (ROBAXIN) 500 MG tablet Take 1 tablet (500 mg total) by mouth every 8 (eight) hours as needed for muscle spasms. 11/04/14   Lyanne Co, MD  montelukast (SINGULAIR) 10 MG tablet Take 10 mg by mouth as needed.    Historical Provider, MD  pravastatin (PRAVACHOL) 20 MG tablet Take 40 mg by mouth daily.     Historical Provider, MD   BP 158/73 mmHg  Pulse 60  Temp(Src) 98.2 F (36.8 C) (Oral)  Resp 16  Ht  (1.575 m)  Wt 179 lb (81.194 kg)  BMI 32.73 kg/m2  SpO2 100% Physical Exam  Constitutional: She is oriented to person, place, and time. She appears well-developed and well-nourished. She appears ill. No distress.  HENT:  Head: Normocephalic and atraumatic.  Eyes: Conjunctivae and EOM are normal.  Cardiovascular: Normal rate and regular rhythm.   Pulmonary/Chest: Effort normal and breath sounds normal. No stridor. No respiratory distress.  Abdominal: She exhibits no distension.  Musculoskeletal: She exhibits no edema.  Neurological: She is alert and oriented to person, place, and time. She displays atrophy. She displays no tremor. No cranial nerve deficit or sensory deficit. She exhibits abnormal muscle tone. She displays no seizure activity.  Patient has symmetric 5/5 strength, grip and otherwise in the upper extremities. Strength is similarly symmetric lower extremity, though the patient describes diminished strength in the left leg. Face is symmetric, speech is clear, no discoordination.  Skin: Skin is warm and dry.  Psychiatric: She has a normal mood and affect.  Nursing note and vitals reviewed.   ED Course  Procedures (including critical care time) Labs Review Labs Reviewed  CBC WITH DIFFERENTIAL/PLATELET - Abnormal; Notable for the following:    Neutrophils Relative % 32 (*)    Lymphocytes Relative 59 (*)    All other components within normal limits  COMPREHENSIVE  METABOLIC PANEL - Abnormal; Notable for the following:    GFR calc non Af Amer 86 (*)    Anion gap 4 (*)    All other components within normal limits  URINALYSIS, ROUTINE W REFLEX MICROSCOPIC - Abnormal; Notable for the following:    Leukocytes, UA SMALL (*)    All other components within normal limits  URINE MICROSCOPIC-ADD ON - Abnormal; Notable for the following:    Squamous Epithelial / LPF FEW (*)    Bacteria, UA FEW (*)    All other components within normal limits  CBG MONITORING, ED - Abnormal; Notable for the following:    Glucose-Capillary 68 (*)    All other components within normal limits  ETHANOL  TROPONIN I  URINE RAPID DRUG SCREEN (HOSP PERFORMED)  APTT    Imaging Review Ct Head Wo Contrast  12/20/2014   CLINICAL DATA:  Dizziness, posterior headache and left arm weakness for the past 2 days. History of hypertension and vertigo.  EXAM: CT HEAD WITHOUT CONTRAST  TECHNIQUE: Contiguous axial images were obtained from the base of the skull through the vertex without intravenous contrast.  COMPARISON:  06/04/2014.  FINDINGS: No significant change in a rounded area of focal white matter low density in the subcortical right frontal lobe. The ventricles remain normal in size and position. No intracranial hemorrhage, mass lesion or CT evidence of acute infarction. Left maxillary sinus air-fluid level. Surgical absence of the medial wall of the left maxillary sinus.  IMPRESSION: 1. 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 2. No acute intracranial abnormality. 3. Acute left maxillary sinusitis.   Electronically Signed   By: Beckie SaltsSteven  Reid M.D.   On: 12/20/2014 14:23     EKG Interpretation   Date/Time:  Saturday December 20 2014 13:21:10 EST Ventricular Rate:  78 PR Interval:  158 QRS Duration: 94 QT Interval:  390 QTC Calculation: 444 R Axis:   39 Text Interpretation:  Normal sinus rhythm Anterior infarct , age  undetermined Abnormal ECG Sinus rhythm q waves anteriorly  No significant  change since last tracing Abnormal ekg Confirmed by Gerhard MunchLOCKWOOD, Eian Vandervelden  MD  [phone]([id number]) on 12/20/2014 1:39:08 PM     I reviewed the patient's chart - no similar prior episodes.  I recommended MRI today for eval, but the patient deferred, stating that she is incapable of doing this test.  3:44 PM Patient in no distress, speaking clearly. Patient is moving the left arm freely, reaching out, scratching her face, no evidence for any kind of asymmetric weakness. I had a very lengthy conversation with the patient and her son about all findings, the need to continue her evaluation as an outpatient. We discussed increasing her 81 mg aspirin to full strength for additional stroke prophylaxis.  MDM  Patient resides with ongoing dizziness, possible left sided weakness, though on exam she has no discernible objective neurologic deficiency. She does appear generally weak, deconditioned, but in no distress. She is hemodynamically stable, awake and alert, with an unremarkable head CT aside from evidence of prior stroke. Patient felt better here, was discharged in stable condition to follow-up with her physicians as scheduled this week.  Gerhard Munchobert Jaquana Geiger, MD 12/20/14 [phone]

## 2014-12-20 NOTE — ED Notes (Signed)
On further discussion patient reports that her left arm weakness started around 0900 today. The dizziness and neck and left shoulder stiffness for quite some time. No other deficits noted

## 2014-12-20 NOTE — Discharge Instructions (Signed)
As discussed, it is very important that you follow-up with your physicians this week for evaluation of your persistent dizziness, neck pain.  Please discussed today's evaluation for the dizziness, possible weakness as well, consider additional imaging and evaluation.  Going forward, it is also important that take a full-strength aspirin daily.  Please discuss this with your physicians this week.

## 2015-01-27 ENCOUNTER — Ambulatory Visit (INDEPENDENT_AMBULATORY_CARE_PROVIDER_SITE_OTHER): Payer: Medicare Other | Admitting: Podiatry

## 2015-01-27 ENCOUNTER — Encounter: Payer: Self-pay | Admitting: Podiatry

## 2015-01-27 VITALS — BP 136/78 | HR 70

## 2015-01-27 DIAGNOSIS — E1140 Type 2 diabetes mellitus with diabetic neuropathy, unspecified: Secondary | ICD-10-CM | POA: Diagnosis not present

## 2015-01-27 DIAGNOSIS — M2010 Hallux valgus (acquired), unspecified foot: Secondary | ICD-10-CM | POA: Diagnosis not present

## 2015-01-27 NOTE — Progress Notes (Signed)
Subjective: [age] year ols female presents stating that bunion and hammer toe on right foot bothers her. She is seeking to have a new pair of diabetic shoes.   Objective: No change in findings since last visit.  Dermatologic: No abnormal lesions.  Vascular: Both feet have warm, good capillary refill, no trophic changes or ulcerative lesions and normal DP and PT pulses  Orthopedic: Bilateral bunion deformity with forefoot varus.  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 Neurologic: All epicritic and tactile sensations grossly intact.  DTR and vibratory sensations normal on both feet.  Normal response to Monofilament sensory testing bilateral.   Assessment:  Bilateral bunion with pain in right bunion. 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   Plan: Both feet measured for diabetic shoes.

## 2015-01-27 NOTE — Patient Instructions (Signed)
Doing well with diabetic shoes. Still having pain from bunion and hammer toe. May benefit from a new pair of diabetic shoes if possible.

## 2015-02-23 ENCOUNTER — Emergency Department (HOSPITAL_BASED_OUTPATIENT_CLINIC_OR_DEPARTMENT_OTHER)
Admission: EM | Admit: 2015-02-23 | Discharge: 2015-02-23 | Discharge status: Against Medical Advice | Payer: Medicare Other | Attending: Emergency Medicine | Admitting: Emergency Medicine

## 2015-02-23 ENCOUNTER — Encounter (HOSPITAL_BASED_OUTPATIENT_CLINIC_OR_DEPARTMENT_OTHER): Payer: Self-pay | Admitting: *Deleted

## 2015-02-23 DIAGNOSIS — I10 Essential (primary) hypertension: Secondary | ICD-10-CM | POA: Diagnosis not present

## 2015-02-23 DIAGNOSIS — J329 Chronic sinusitis, unspecified: Secondary | ICD-10-CM | POA: Diagnosis not present

## 2015-02-23 DIAGNOSIS — J45909 Unspecified asthma, uncomplicated: Secondary | ICD-10-CM | POA: Diagnosis not present

## 2015-02-23 NOTE — ED Notes (Signed)
States she has a sinus infection. Symptoms of facial pain, stuffy nose and eye drainage for a week.

## 2015-03-13 ENCOUNTER — Encounter (HOSPITAL_BASED_OUTPATIENT_CLINIC_OR_DEPARTMENT_OTHER): Payer: Self-pay | Admitting: *Deleted

## 2015-03-13 ENCOUNTER — Emergency Department (HOSPITAL_BASED_OUTPATIENT_CLINIC_OR_DEPARTMENT_OTHER): Payer: Medicare Other

## 2015-03-13 ENCOUNTER — Emergency Department (HOSPITAL_BASED_OUTPATIENT_CLINIC_OR_DEPARTMENT_OTHER)
Admission: EM | Admit: 2015-03-13 | Discharge: 2015-03-13 | Disposition: A | Discharge status: Home / Self Care | Payer: Medicare Other | Attending: Emergency Medicine | Admitting: Emergency Medicine

## 2015-03-13 ENCOUNTER — Emergency Department: Payer: Medicare Other

## 2015-03-13 ENCOUNTER — Other Ambulatory Visit (HOSPITAL_BASED_OUTPATIENT_CLINIC_OR_DEPARTMENT_OTHER): Payer: Medicare Other

## 2015-03-13 DIAGNOSIS — R202 Paresthesia of skin: Secondary | ICD-10-CM

## 2015-03-13 DIAGNOSIS — Z79899 Other long term (current) drug therapy: Secondary | ICD-10-CM | POA: Insufficient documentation

## 2015-03-13 DIAGNOSIS — R51 Headache: Secondary | ICD-10-CM | POA: Insufficient documentation

## 2015-03-13 DIAGNOSIS — E785 Hyperlipidemia, unspecified: Secondary | ICD-10-CM | POA: Diagnosis not present

## 2015-03-13 DIAGNOSIS — R200 Anesthesia of skin: Secondary | ICD-10-CM

## 2015-03-13 DIAGNOSIS — Z7982 Long term (current) use of aspirin: Secondary | ICD-10-CM | POA: Diagnosis not present

## 2015-03-13 DIAGNOSIS — I10 Essential (primary) hypertension: Secondary | ICD-10-CM | POA: Diagnosis not present

## 2015-03-13 DIAGNOSIS — Z7951 Long term (current) use of inhaled steroids: Secondary | ICD-10-CM | POA: Diagnosis not present

## 2015-03-13 DIAGNOSIS — Z880 Allergy status to penicillin: Secondary | ICD-10-CM | POA: Insufficient documentation

## 2015-03-13 DIAGNOSIS — K219 Gastro-esophageal reflux disease without esophagitis: Secondary | ICD-10-CM | POA: Insufficient documentation

## 2015-03-13 DIAGNOSIS — J45909 Unspecified asthma, uncomplicated: Secondary | ICD-10-CM | POA: Diagnosis not present

## 2015-03-13 DIAGNOSIS — Z87891 Personal history of nicotine dependence: Secondary | ICD-10-CM | POA: Diagnosis not present

## 2015-03-13 DIAGNOSIS — R519 Headache, unspecified: Secondary | ICD-10-CM

## 2015-03-13 LAB — CBC WITH DIFFERENTIAL/PLATELET
BASOS ABS: 0 10*3/uL (ref 0.0–0.1)
Basophils Relative: 0 % (ref 0–1)
EOS PCT: 2 % (ref 0–5)
Eosinophils Absolute: 0.1 10*3/uL (ref 0.0–0.7)
HCT: 40.3 % (ref 36.0–46.0)
HEMOGLOBIN: 13.2 g/dL (ref 12.0–15.0)
Lymphocytes Relative: 50 % — ABNORMAL HIGH (ref 12–46)
Lymphs Abs: 2.7 10*3/uL (ref 0.7–4.0)
MCH: 27.2 pg (ref 26.0–34.0)
MCHC: 32.8 g/dL (ref 30.0–36.0)
MCV: 83.1 fL (ref 78.0–100.0)
Monocytes Absolute: 0.4 10*3/uL (ref 0.1–1.0)
Monocytes Relative: 7 % (ref 3–12)
NEUTROS ABS: 2.2 10*3/uL (ref 1.7–7.7)
NEUTROS PCT: 41 % — AB (ref 43–77)
PLATELETS: 285 10*3/uL (ref 150–400)
RBC: 4.85 MIL/uL (ref 3.87–5.11)
RDW: 17.1 % — ABNORMAL HIGH (ref 11.5–15.5)
WBC: 5.3 10*3/uL (ref 4.0–10.5)

## 2015-03-13 LAB — BASIC METABOLIC PANEL
ANION GAP: 9 (ref 5–15)
BUN: 16 mg/dL (ref 6–20)
CALCIUM: 9.3 mg/dL (ref 8.9–10.3)
CHLORIDE: 106 mmol/L (ref 101–111)
CO2: 28 mmol/L (ref 22–32)
Creatinine, Ser: 0.73 mg/dL (ref 0.44–1.00)
GFR calc Af Amer: >60 mL/min (ref >60)
GFR calc non Af Amer: >60 mL/min (ref >60)
Glucose, Bld: 120 mg/dL — ABNORMAL HIGH (ref 65–99)
Potassium: 4 mmol/L (ref 3.5–5.1)
SODIUM: 143 mmol/L (ref 135–145)

## 2015-03-13 LAB — CBG MONITORING, ED
GLUCOSE-CAPILLARY: 58 mg/dL — AB (ref 65–99)
Glucose-Capillary: 119 mg/dL — ABNORMAL HIGH (ref 65–99)

## 2015-03-13 NOTE — ED Notes (Addendum)
Pt c/o migraine x 1 weeks, being see by PMD for same and ENT , today clo left side facial numbness, speech clear

## 2015-03-13 NOTE — ED Notes (Signed)
Pt given graham crackers and peanut butter and water per request.

## 2015-03-13 NOTE — ED Notes (Signed)
Pt states her left jaw feels like it is locking up when she opens and closes her mouth.

## 2015-03-13 NOTE — Discharge Instructions (Signed)
You were evaluated in the ED today for your facial pain and tingling. There does not appear to be an emergent cause for her symptoms at this time. Your exam and CT scan of your head today were reassuring. It is important to follow-up with your primary care as well as your neurologist for further evaluation and management of your symptoms. Return to ED for new or worsening symptoms.  Migraine Headache A migraine headache is very bad, throbbing pain on one or both sides of your head. Talk to your doctor about what things may bring on (trigger) your migraine headaches. HOME CARE  Only take medicines as told by your doctor.  Lie down in a dark, quiet room when you have a migraine.  Keep a journal to find out if certain things bring on migraine headaches. For example, write down:  What you eat and drink.  How much sleep you get.  Any change to your diet or medicines.  Lessen how much alcohol you drink.  Quit smoking if you smoke.  Get enough sleep.  Lessen any stress in your life.  Keep lights dim if bright lights bother you or make your migraines worse. GET HELP RIGHT AWAY IF:   Your migraine becomes really bad.  You have a fever.  You have a stiff neck.  You have trouble seeing.  Your muscles are weak, or you lose muscle control.  You lose your balance or have trouble walking.  You feel like you will pass out (faint), or you pass out.  You have really bad symptoms that are different than your first symptoms. MAKE SURE YOU:   Understand these instructions.  Will watch your condition.  Will get help right away if you are not doing well or get worse. Document Released: 07/26/2008 Document Revised: 01/09/2012 Document Reviewed: 06/24/2013 [facility] Patient Information 2015 [place], [place]. This information is not intended to replace advice given to you by your health care provider. Make sure you discuss any questions you have with your health care  provider.  Paresthesia Paresthesia is an abnormal burning or prickling sensation. This sensation is generally felt in the hands, arms, legs, or feet. However, it may occur in any part of the body. It is usually not painful. The feeling may be described as:  Tingling or numbness.  "Pins and needles."  Skin crawling.  Buzzing.  Limbs "falling asleep."  Itching. Most people experience temporary (transient) paresthesia at some time in their lives. CAUSES  Paresthesia may occur when you breathe too quickly (hyperventilation). It can also occur without any apparent cause. Commonly, paresthesia occurs when pressure is placed on a nerve. The feeling quickly goes away once the pressure is removed. For some people, however, paresthesia is a long-lasting (chronic) condition caused by an underlying disorder. The underlying disorder may be:  A traumatic, direct injury to nerves. Examples include a:  Broken (fractured) neck.  Fractured skull.  A disorder affecting the brain and spinal cord (central nervous system). Examples include:  Transverse myelitis.  Encephalitis.  Transient ischemic attack.  Multiple sclerosis.  Stroke.  Tumor or blood vessel problems, such as an arteriovenous malformation pressing against the brain or spinal cord.  A condition that damages the peripheral nerves (peripheral neuropathy). Peripheral nerves are not part of the brain and spinal cord. These conditions include:  Diabetes.  Peripheral vascular disease.  Nerve entrapment syndromes, such as carpal tunnel syndrome.  Shingles.  Hypothyroidism.  Vitamin B12 deficiencies.  Alcoholism.  Heavy metal poisoning (lead, arsenic).  Rheumatoid arthritis.  Systemic lupus erythematosus. DIAGNOSIS  Your caregiver will attempt to find the underlying cause of your paresthesia. Your caregiver may:  Take your medical history.  Perform a physical exam.  Order various lab tests.  Order imaging  tests. TREATMENT  Treatment for paresthesia depends on the underlying cause. HOME CARE INSTRUCTIONS  Avoid drinking alcohol.  You may consider massage or acupuncture to help relieve your symptoms.  Keep all follow-up appointments as directed by your caregiver. SEEK IMMEDIATE MEDICAL CARE IF:   You feel weak.  You have trouble walking or moving.  You have problems with speech or vision.  You feel confused.  You cannot control your bladder or bowel movements.  You feel numbness after an injury.  You faint.  Your burning or prickling feeling gets worse when walking.  You have pain, cramps, or dizziness.  You develop a rash. MAKE SURE YOU:  Understand these instructions.  Will watch your condition.  Will get help right away if you are not doing well or get worse. Document Released: 10/07/2002 Document Revised: 01/09/2012 Document Reviewed: 07/08/2011 [facility] Patient Information 2015 [place], [place]. This information is not intended to replace advice given to you by your health care provider. Make sure you discuss any questions you have with your health care provider.

## 2015-03-13 NOTE — ED Notes (Signed)
Pt request something for h/a. PA aware. Stated instruct pt to take tylenol at home.

## 2015-03-13 NOTE — ED Provider Notes (Signed)
CSN: [id number]     Arrival date & time 03/13/15  1319 History   First MD Initiated Contact with Patient 03/13/15 1339     Chief Complaint  Patient presents with  . Numbness     (Consider location/radiation/quality/duration/timing/severity/associated sxs/prior Treatment) HPI Donna Frank is a 69 y.o. female history of vertigo, reported stroke "many many years ago", comes in for evaluation of sudden onset headache. Patient states she was at her ENT office this morning, left at approximately 12:15 PM, was driving and expressed a sudden onset left-sided headache. She was unable to characterize the discomfort but states "I felt like I was going to pass out". She reports the discomfort radiated throughout her cheek and left neck. She reports her neurologist gave her a shot at this location last week. She denies any numbness or weakness, difficulty swallowing or breathing, changes in speech. However, at this time she does report a "tightness" sensation in her left cheek. Reports she is still taking 81 mg of aspirin, although it was recommended that she take 325 mg.  Past Medical History  Diagnosis Date  . Hypertension   . Vertigo   . Hyperlipidemia   . Pinched nerve in neck     neck and shoulder  . Asthma   . Reflux   . Hiatal hernia   . Pre-diabetes    Past Surgical History  Procedure Laterality Date  . Abdominal hysterectomy     Family History  Problem Relation Age of Onset  . Emphysema Maternal Grandfather   . COPD Mother   . Heart disease Maternal Uncle   . Heart disease Maternal Aunt   . Rheum arthritis Sister    History  Substance Use Topics  . Smoking status: Former Smoker -- 0.50 packs/day for 35 years    Quit date: 12/21/2012  . Smokeless tobacco: Never Used  . Alcohol Use: No   OB History    No data available     Review of Systems A 10 point review of systems was completed and was negative except for pertinent positives and negatives as mentioned in the history of  present illness     Allergies  Codeine; Iodine; Penicillins; Prednisone; Sulfa antibiotics; and Vicodin  Home Medications   Prior to Admission medications   Medication Sig Start Date End Date Taking? Authorizing Provider  albuterol (PROVENTIL) (2.5 MG/3ML) 0.083% nebulizer solution Take 2.5 mg by nebulization every 6 (six) hours as needed for wheezing or shortness of breath.    Historical Provider, MD  ALPRAZolam Prudy Feeler(XANAX) 0.25 MG tablet Take 1 tablet (0.25 mg total) by mouth 3 (three) times daily as needed for anxiety. 03/08/13   Paula LibraJohn Molpus, MD  aspirin 81 MG chewable tablet Chew 4 tablets (324 mg total) by mouth daily. 12/20/14   Gerhard Munchobert Lockwood, MD  beclomethasone (QVAR) 40 MCG/ACT inhaler Inhale 2 puffs into the lungs 2 (two) times daily.    Historical Provider, MD  cetirizine (ZYRTEC) 10 MG tablet Take 10 mg by mouth daily.    Historical Provider, MD  cholecalciferol (VITAMIN D) 1000 UNITS tablet Take 1,000 Units by mouth daily.    Historical Provider, MD  esomeprazole (NEXIUM) 40 MG capsule Take 40 mg by mouth daily before breakfast.    Historical Provider, MD  ibuprofen (ADVIL,MOTRIN) 600 MG tablet Take 1 tablet (600 mg total) by mouth every 8 (eight) hours as needed. 11/04/14   Azalia BilisKevin Campos, MD  lisinopril (PRINIVIL,ZESTRIL) 10 MG tablet Take 30 mg by mouth daily.  Historical Provider, MD  losartan (COZAAR) 50 MG tablet Take 1 tablet (50 mg total) by mouth daily. 09/11/14   Oretha Milchakesh Alva V, MD  meclizine (ANTIVERT) 25 MG tablet Take 25 mg by mouth 3 (three) times daily as needed.    Historical Provider, MD  metFORMIN (GLUCOPHAGE) 500 MG tablet Take 500 mg by mouth. Half tab with meal at evening    Historical Provider, MD  methocarbamol (ROBAXIN) 500 MG tablet Take 1 tablet (500 mg total) by mouth every 8 (eight) hours as needed for muscle spasms. 11/04/14   Azalia BilisKevin Campos, MD  montelukast (SINGULAIR) 10 MG tablet Take 10 mg by mouth as needed.    Historical Provider, MD  pravastatin  (PRAVACHOL) 20 MG tablet Take 40 mg by mouth daily.     Historical Provider, MD   BP 151/74 mmHg  Pulse 73  Temp(Src) 98.2 F (36.8 C)  Resp 18  Ht 5\' 2"  (1.575 m)  Wt 174 lb (78.926 kg)  BMI 31.82 kg/m2  SpO2 97% Physical Exam  Constitutional: She is oriented to person, place, and time. She appears well-developed and well-nourished.  HENT:  Head: Normocephalic and atraumatic.  Mouth/Throat: Oropharynx is clear and moist.  Eyes: Conjunctivae are normal. Pupils are equal, round, and reactive to light. Right eye exhibits no discharge. Left eye exhibits no discharge. No scleral icterus.  Neck: Neck supple.  Cardiovascular: Normal rate, regular rhythm and normal heart sounds.   Pulmonary/Chest: Effort normal and breath sounds normal. No respiratory distress. She has no wheezes. She has no rales.  Abdominal: Soft. There is no tenderness.  Musculoskeletal: She exhibits no tenderness.  Neurological: She is alert and oriented to person, place, and time.  Cranial Nerves II-XII grossly intact, however patient reports diminished sensation in mandibular region on the left side. Motor and sensation otherwise 5/5 in all 4 extremities. No pronator drift, struck her movements intact without nystagmus. Completes finger to nose coordination movements without difficulty  Skin: Skin is warm and dry. No rash noted.  Psychiatric: She has a normal mood and affect.  Nursing note and vitals reviewed.   ED Course  Procedures (including critical care time) Labs Review Labs Reviewed  BASIC METABOLIC PANEL - Abnormal; Notable for the following:    Glucose, Bld 120 (*)    All other components within normal limits  CBC WITH DIFFERENTIAL/PLATELET - Abnormal; Notable for the following:    RDW 17.1 (*)    Neutrophils Relative % 41 (*)    Lymphocytes Relative 50 (*)    All other components within normal limits  CBG MONITORING, ED - Abnormal; Notable for the following:    Glucose-Capillary 58 (*)    All other  components within normal limits  CBG MONITORING, ED - Abnormal; Notable for the following:    Glucose-Capillary 119 (*)    All other components within normal limits    Imaging Review Ct Head Wo Contrast  03/13/2015   CLINICAL DATA:  Pt states she had steroid injections in her neck last Wednesday and has been having issues ever sinceC/o HA at vertex, no blurred vision, some dizziness, left sided facial pain in TMJ area, difficulty/pain opening and closing mouth  EXAM: CT HEAD WITHOUT CONTRAST  TECHNIQUE: Contiguous axial images were obtained from the base of the skull through the vertex without intravenous contrast.  COMPARISON:  12/20/2014  FINDINGS: Ventricles are normal in size and configuration.  There are no parenchymal masses or mass effect. Focus of hypoattenuation in the subcortical white matter  of the right frontal lobe is stable consistent with an area of chronic ischemic change. There is no evidence of a cortical infarct.  There are no extra-axial masses or abnormal fluid collections.  There is no intracranial hemorrhage.  Visualized sinuses and mastoid air cells are clear. No skull lesion.  IMPRESSION: 1. No acute intracranial abnormalities. 2. Small area of ischemic change in the subcortical white matter of the right frontal lobe.   Electronically Signed   By: Amie Portland M.D.   On: 03/13/2015 15:10     EKG Interpretation None     Meds given in ED:  Medications - No data to display  New Prescriptions   No medications on file   Filed Vitals:   03/13/15 1326 03/13/15 1424 03/13/15 1635  BP: 174/103 169/74 151/74  Pulse: 73 60 73  Temp: 98.2 F (36.8 C)    Resp: Height:  (1.575 m)    Weight: 174 lb (78.926 kg)    SpO2: 98% 97% 97%    MDM  Vitals stable - WNL -afebrile Pt resting comfortably in ED. PE--upon reevaluation by attending, Dr. Madilyn Hook, facial paresthesias have resolved and patient is at baseline. Neuro exam is otherwise unremarkable as patient has  no appreciable focal deficits. Labwork --labs are at baseline and essentially noncontributory. CBG on arrival 38 which has since improved to 119 with juice. Pt tolerating PO. Imaging--CT head shows no acute intracranial pathology. There is evidence of old, chronic ischemic changes in the right frontal lobe that have been evident on prior CT scans. Patient declines MRI at this time.  Discussed she will need to follow-up with her neurologist this week for further evaluation and management of her symptoms. Patient is agreeable to this plan and reports she is ready to go home at this time. Patient is stable, in good condition and is appropriate for discharge. I discussed all relevant lab findings and imaging results with pt and they verbalized understanding. Discussed f/u with PCP within 48 hrs and return precautions, pt very amenable to plan.  Prior to patient discharge, I discussed and reviewed this case with Dr. Madilyn Hook, who also saw and evaluated the patient     Final diagnoses:  Paresthesia  Facial pain       Joycie Peek, PA-C 03/13/15 1859  Tilden Fossa, MD 03/16/15 [phone]

## 2015-03-13 NOTE — ED Notes (Signed)
PA aware of blood sugar. Okay to given OJ. Given.

## 2015-04-08 ENCOUNTER — Emergency Department (HOSPITAL_BASED_OUTPATIENT_CLINIC_OR_DEPARTMENT_OTHER): Payer: Medicare Other

## 2015-04-08 ENCOUNTER — Emergency Department (HOSPITAL_BASED_OUTPATIENT_CLINIC_OR_DEPARTMENT_OTHER)
Admission: EM | Admit: 2015-04-08 | Discharge: 2015-04-08 | Disposition: A | Discharge status: Home / Self Care | Payer: Medicare Other | Attending: Emergency Medicine | Admitting: Emergency Medicine

## 2015-04-08 ENCOUNTER — Encounter (HOSPITAL_BASED_OUTPATIENT_CLINIC_OR_DEPARTMENT_OTHER): Payer: Self-pay

## 2015-04-08 DIAGNOSIS — K219 Gastro-esophageal reflux disease without esophagitis: Secondary | ICD-10-CM | POA: Diagnosis not present

## 2015-04-08 DIAGNOSIS — Z7951 Long term (current) use of inhaled steroids: Secondary | ICD-10-CM | POA: Diagnosis not present

## 2015-04-08 DIAGNOSIS — M545 Low back pain: Secondary | ICD-10-CM | POA: Diagnosis not present

## 2015-04-08 DIAGNOSIS — Z87891 Personal history of nicotine dependence: Secondary | ICD-10-CM | POA: Diagnosis not present

## 2015-04-08 DIAGNOSIS — E785 Hyperlipidemia, unspecified: Secondary | ICD-10-CM | POA: Insufficient documentation

## 2015-04-08 DIAGNOSIS — I10 Essential (primary) hypertension: Secondary | ICD-10-CM | POA: Diagnosis not present

## 2015-04-08 DIAGNOSIS — Z7982 Long term (current) use of aspirin: Secondary | ICD-10-CM | POA: Insufficient documentation

## 2015-04-08 DIAGNOSIS — Z79899 Other long term (current) drug therapy: Secondary | ICD-10-CM | POA: Insufficient documentation

## 2015-04-08 DIAGNOSIS — Z9889 Other specified postprocedural states: Secondary | ICD-10-CM | POA: Diagnosis not present

## 2015-04-08 DIAGNOSIS — G8929 Other chronic pain: Secondary | ICD-10-CM | POA: Insufficient documentation

## 2015-04-08 DIAGNOSIS — J45909 Unspecified asthma, uncomplicated: Secondary | ICD-10-CM | POA: Diagnosis not present

## 2015-04-08 DIAGNOSIS — Z880 Allergy status to penicillin: Secondary | ICD-10-CM | POA: Insufficient documentation

## 2015-04-08 DIAGNOSIS — M25559 Pain in unspecified hip: Secondary | ICD-10-CM

## 2015-04-08 DIAGNOSIS — M25552 Pain in left hip: Secondary | ICD-10-CM | POA: Diagnosis present

## 2015-04-08 DIAGNOSIS — M1990 Unspecified osteoarthritis, unspecified site: Secondary | ICD-10-CM | POA: Insufficient documentation

## 2015-04-08 MED ORDER — CYCLOBENZAPRINE HCL 5 MG PO TABS: 5.0000 mg | ORAL_TABLET | Freq: Two times a day (BID) | ORAL | Status: DC | PRN

## 2015-04-08 MED ORDER — CYCLOBENZAPRINE HCL 10 MG PO TABS
5.0000 mg | ORAL_TABLET | Freq: Once | ORAL | Status: AC
  Administered 2015-04-08: 5 mg via ORAL
  Filled 2015-04-08: qty 1

## 2015-04-08 NOTE — ED Notes (Signed)
Patient transported to X-ray 

## 2015-04-08 NOTE — Discharge Instructions (Signed)
Take tylenol for pain.  Take flexeril for muscle spasms.   See orthopedic doctor for further management of your arthritis and back pain.  Return to ER if you have trouble walking, severe pain, trouble urinating.

## 2015-04-08 NOTE — ED Notes (Signed)
C/o left hip pain that radiates down entire left leg x 3 days-denies injury-steady gait

## 2015-04-08 NOTE — ED Notes (Signed)
MD at bedside. 

## 2015-04-08 NOTE — ED Provider Notes (Signed)
CSN: [id number]     Arrival date & time 04/08/15  1149 History   First MD Initiated Contact with Patient 04/08/15 1154     Chief Complaint  Patient presents with  . Hip Pain     (Consider location/radiation/quality/duration/timing/severity/associated sxs/prior Treatment) The history is provided by the patient.  Donna Frank is a 69 y.o. female hx of HTN, chronic vertigo, prediabetes here presenting with back pain, leg pain. Patient has been having chronic back pain but worsening over the last 3 days. It radiated down her left leg. Denies any fall or injury or heavy lifting. States that she has pain when she walks but is able to walk and denies any urinary or fecal incontinence. Fevers or chills. She has orthopedic doctor and had previous shoulder injections but had no spinal injections.    Past Medical History  Diagnosis Date  . Hypertension   . Vertigo   . Hyperlipidemia   . Pinched nerve in neck     neck and shoulder  . Asthma   . Reflux   . Hiatal hernia   . Pre-diabetes    Past Surgical History  Procedure Laterality Date  . Abdominal hysterectomy     Family History  Problem Relation Age of Onset  . Emphysema Maternal Grandfather   . COPD Mother   . Heart disease Maternal Uncle   . Heart disease Maternal Aunt   . Rheum arthritis Sister    History  Substance Use Topics  . Smoking status: Former Smoker -- 0.50 packs/day for 35 years    Quit date: 12/21/2012  . Smokeless tobacco: Never Used  . Alcohol Use: No   OB History    No data available     Review of Systems  Musculoskeletal: Positive for back pain.  All other systems reviewed and are negative.     Allergies  Codeine; Iodine; Penicillins; Prednisone; Sulfa antibiotics; and Vicodin  Home Medications   Prior to Admission medications   Medication Sig Start Date End Date Taking? Authorizing Provider  amLODipine (NORVASC) 5 MG tablet Take 5 mg by mouth daily.   Yes Historical Provider, MD  albuterol  (PROVENTIL) (2.5 MG/3ML) 0.083% nebulizer solution Take 2.5 mg by nebulization every 6 (six) hours as needed for wheezing or shortness of breath.    Historical Provider, MD  ALPRAZolam Prudy Feeler) 0.25 MG tablet Take 1 tablet (0.25 mg total) by mouth 3 (three) times daily as needed for anxiety. 03/08/13   Paula Libra, MD  aspirin 81 MG chewable tablet Chew 4 tablets (324 mg total) by mouth daily. 12/20/14   Gerhard Munch, MD  beclomethasone (QVAR) 40 MCG/ACT inhaler Inhale 2 puffs into the lungs 2 (two) times daily.    Historical Provider, MD  cetirizine (ZYRTEC) 10 MG tablet Take 10 mg by mouth daily.    Historical Provider, MD  cholecalciferol (VITAMIN D) 1000 UNITS tablet Take 1,000 Units by mouth daily.    Historical Provider, MD  esomeprazole (NEXIUM) 40 MG capsule Take 40 mg by mouth daily before breakfast.    Historical Provider, MD  ibuprofen (ADVIL,MOTRIN) 600 MG tablet Take 1 tablet (600 mg total) by mouth every 8 (eight) hours as needed. 11/04/14   Azalia Bilis, MD  lisinopril (PRINIVIL,ZESTRIL) 10 MG tablet Take 30 mg by mouth daily.     Historical Provider, MD  losartan (COZAAR) 50 MG tablet Take 1 tablet (50 mg total) by mouth daily. 09/11/14   Oretha Milch, MD  meclizine (ANTIVERT) 25 MG tablet Take 25  mg by mouth 3 (three) times daily as needed.    Historical Provider, MD  metFORMIN (GLUCOPHAGE) 500 MG tablet Take 500 mg by mouth. Half tab with meal at evening    Historical Provider, MD  methocarbamol (ROBAXIN) 500 MG tablet Take 1 tablet (500 mg total) by mouth every 8 (eight) hours as needed for muscle spasms. 11/04/14   Azalia Bilis, MD  montelukast (SINGULAIR) 10 MG tablet Take 10 mg by mouth as needed.    Historical Provider, MD  pravastatin (PRAVACHOL) 20 MG tablet Take 40 mg by mouth daily.     Historical Provider, MD   BP 126/79 mmHg  Pulse 94  Temp(Src) 97.9 F (36.6 C) (Oral)  Resp 18  Ht  (1.575 m)  Wt 174 lb (78.926 kg)  BMI 31.82 kg/m2  SpO2 96% Physical Exam   Constitutional: She is oriented to person, place, and time.  Uncomfortable   HENT:  Head: Normocephalic.  Mouth/Throat: Oropharynx is clear and moist.  Eyes: Conjunctivae are normal. Pupils are equal, round, and reactive to light.  Neck: Normal range of motion. Neck supple.  Cardiovascular: Normal rate, regular rhythm and normal heart sounds.   Pulmonary/Chest: Effort normal and breath sounds normal. No respiratory distress. She has no wheezes. She has no rales.  Abdominal: Soft. Bowel sounds are normal. She exhibits no distension. There is no tenderness. There is no rebound.  Musculoskeletal:  L parathoracic and paralumbar tenderness. Mild dec ROM L hip but no deformity   Neurological: She is alert and oriented to person, place, and time.  + straight leg raise on L side, no saddle anesthesia. Neurovascular intact. Nl gait   Skin: Skin is warm and dry.  Psychiatric: She has a normal mood and affect. Her behavior is normal. Judgment and thought content normal.  Nursing note and vitals reviewed.   ED Course  Procedures (including critical care time) Labs Review Labs Reviewed - No data to display  Imaging Review Dg Thoracic Spine 2 View  04/08/2015   CLINICAL DATA:  69 year old female with thoracolumbar back pain radiating to the left hip. Left lower extremity weakness. Initial encounter.  EXAM: THORACIC SPINE - 2 VIEW  COMPARISON:  Cervical spine MRI 12/25/2014. Chest radiographs 10/18/2014 and earlier.  FINDINGS: Normal thoracic segmentation. Bone mineralization is within normal limits. Thoracic vertebral height and alignment are stable and within normal limits. Cervicothoracic junction alignment is within normal limits. Multilevel mild to moderate thoracic degenerative endplate spurring. Visible upper lumbar levels appear intact. Posterior ribs appear grossly intact. Visualized thoracic visceral contours are normal.  IMPRESSION: No acute fracture or listhesis identified in the thoracic  spine.   Electronically Signed   By: Odessa Fleming M.D.   On: 04/08/2015 12:44   Dg Lumbar Spine Complete  04/08/2015   CLINICAL DATA:  69 year old female with thoracolumbar back pain radiating to the left hip. Left lower extremity weakness. Initial encounter.  EXAM: LUMBAR SPINE - COMPLETE 4+ VIEW  COMPARISON:  Thoracic radiographs from today reported separately.  FINDINGS: Normal lumbar segmentation. Grade 1 anterolisthesis at L4-L5 associated with moderate lumbar facet hypertrophy from L3 to the sacrum. No associated pars fracture. Lumbar vertebral height and alignment elsewhere within normal limits. Mild lumbar disc space loss, maximal at L2-L3 and L3-L4. Sacral ala and SI joints within normal limits. Aortoiliac calcified atherosclerosis noted. Visible pelvis and proximal left femur grossly intact.  IMPRESSION: 1.  No acute fracture or listhesis identified in the lumbar spine. 2. Degenerative appearing grade 1 anterolisthesis  at L4-L5 with moderate lower lumbar facet arthropathy.   Electronically Signed   By: Odessa FlemingH  Hall M.D.   On: 04/08/2015 12:46   Dg Hip Unilat With Pelvis 2-3 Views Left  04/08/2015   CLINICAL DATA:  LEFT hip pain.  Back pain.  Initial encounter.  EXAM: LEFT HIP (WITH PELVIS) 2-3 VIEWS  COMPARISON:  None.  FINDINGS: The hips appear located. Hip joint spaces are symmetric. No fracture. Proximal LEFT femur appears normal. Pubic symphysis degenerative disease. Sacral arcades grossly appear intact.  IMPRESSION: Negative.   Electronically Signed   By: Andreas NewportGeoffrey  Lamke M.D.   On: 04/08/2015 12:45     EKG Interpretation None      MDM   Final diagnoses:  Hip pain   Donna Frank is a 69 y.o. female here with hip pain. Consider sciatica vs hip arthritis vs muscle spasms. Will get xrays. Neurovascular intact so will not need MRI.   12:52 PM Xray showed multi level disease. She states that tramadol and narcotics knock her out. Given flexeril and feels better. Will dc home with same. Will refer  her to ortho.    Richardean Canalavid H Yao, MD 04/08/15 [phone]

## 2015-10-06 ENCOUNTER — Other Ambulatory Visit: Payer: Self-pay | Admitting: Allergy

## 2015-10-06 MED ORDER — EPINEPHRINE 0.3 MG/0.3ML IJ SOAJ: 0.3000 mg | Freq: Once | INTRAMUSCULAR | Status: DC

## 2015-10-21 ENCOUNTER — Emergency Department (HOSPITAL_BASED_OUTPATIENT_CLINIC_OR_DEPARTMENT_OTHER)
Admission: EM | Admit: 2015-10-21 | Discharge: 2015-10-21 | Disposition: A | Discharge status: Home / Self Care | Payer: Medicare Other | Attending: Emergency Medicine | Admitting: Emergency Medicine

## 2015-10-21 ENCOUNTER — Encounter (HOSPITAL_BASED_OUTPATIENT_CLINIC_OR_DEPARTMENT_OTHER): Payer: Self-pay

## 2015-10-21 ENCOUNTER — Emergency Department (HOSPITAL_BASED_OUTPATIENT_CLINIC_OR_DEPARTMENT_OTHER): Payer: Medicare Other

## 2015-10-21 DIAGNOSIS — E119 Type 2 diabetes mellitus without complications: Secondary | ICD-10-CM | POA: Insufficient documentation

## 2015-10-21 DIAGNOSIS — Z7982 Long term (current) use of aspirin: Secondary | ICD-10-CM | POA: Insufficient documentation

## 2015-10-21 DIAGNOSIS — M791 Myalgia: Secondary | ICD-10-CM | POA: Diagnosis present

## 2015-10-21 DIAGNOSIS — J189 Pneumonia, unspecified organism: Secondary | ICD-10-CM

## 2015-10-21 DIAGNOSIS — H748X3 Other specified disorders of middle ear and mastoid, bilateral: Secondary | ICD-10-CM | POA: Insufficient documentation

## 2015-10-21 DIAGNOSIS — Z87891 Personal history of nicotine dependence: Secondary | ICD-10-CM | POA: Insufficient documentation

## 2015-10-21 DIAGNOSIS — J45909 Unspecified asthma, uncomplicated: Secondary | ICD-10-CM | POA: Insufficient documentation

## 2015-10-21 DIAGNOSIS — K219 Gastro-esophageal reflux disease without esophagitis: Secondary | ICD-10-CM | POA: Diagnosis not present

## 2015-10-21 DIAGNOSIS — H9209 Otalgia, unspecified ear: Secondary | ICD-10-CM | POA: Diagnosis not present

## 2015-10-21 DIAGNOSIS — E785 Hyperlipidemia, unspecified: Secondary | ICD-10-CM | POA: Diagnosis not present

## 2015-10-21 DIAGNOSIS — Z7952 Long term (current) use of systemic steroids: Secondary | ICD-10-CM | POA: Insufficient documentation

## 2015-10-21 DIAGNOSIS — R000 Tachycardia, unspecified: Secondary | ICD-10-CM | POA: Insufficient documentation

## 2015-10-21 DIAGNOSIS — I10 Essential (primary) hypertension: Secondary | ICD-10-CM | POA: Diagnosis not present

## 2015-10-21 DIAGNOSIS — Z8669 Personal history of other diseases of the nervous system and sense organs: Secondary | ICD-10-CM | POA: Insufficient documentation

## 2015-10-21 DIAGNOSIS — J159 Unspecified bacterial pneumonia: Secondary | ICD-10-CM | POA: Insufficient documentation

## 2015-10-21 DIAGNOSIS — Z79899 Other long term (current) drug therapy: Secondary | ICD-10-CM | POA: Diagnosis not present

## 2015-10-21 DIAGNOSIS — Z7951 Long term (current) use of inhaled steroids: Secondary | ICD-10-CM | POA: Insufficient documentation

## 2015-10-21 HISTORY — DX: Type 2 diabetes mellitus without complications: E11.9

## 2015-10-21 LAB — CBC WITH DIFFERENTIAL/PLATELET
Basophils Absolute: 0 10*3/uL (ref 0.0–0.1)
Basophils Relative: 0 %
EOS ABS: 0 10*3/uL (ref 0.0–0.7)
EOS PCT: 0 %
HCT: 39.9 % (ref 36.0–46.0)
HEMOGLOBIN: 12.9 g/dL (ref 12.0–15.0)
LYMPHS ABS: 3.3 10*3/uL (ref 0.7–4.0)
LYMPHS PCT: 25 %
MCH: 26.7 pg (ref 26.0–34.0)
MCHC: 32.3 g/dL (ref 30.0–36.0)
MCV: 82.4 fL (ref 78.0–100.0)
MONOS PCT: 7 %
Monocytes Absolute: 0.9 10*3/uL (ref 0.1–1.0)
Neutro Abs: 9 10*3/uL — ABNORMAL HIGH (ref 1.7–7.7)
Neutrophils Relative %: 68 %
Platelets: 311 10*3/uL (ref 150–400)
RBC: 4.84 MIL/uL (ref 3.87–5.11)
RDW: 16.6 % — ABNORMAL HIGH (ref 11.5–15.5)
WBC: 13.2 10*3/uL — ABNORMAL HIGH (ref 4.0–10.5)

## 2015-10-21 LAB — BASIC METABOLIC PANEL
Anion gap: 10 (ref 5–15)
BUN: 18 mg/dL (ref 6–20)
CHLORIDE: 101 mmol/L (ref 101–111)
CO2: 30 mmol/L (ref 22–32)
CREATININE: 1.01 mg/dL — AB (ref 0.44–1.00)
Calcium: 9.2 mg/dL (ref 8.9–10.3)
GFR calc Af Amer: >60 mL/min (ref >60)
GFR calc non Af Amer: 57 mL/min — ABNORMAL LOW (ref >60)
GLUCOSE: 87 mg/dL (ref 65–99)
POTASSIUM: 3.9 mmol/L (ref 3.5–5.1)
SODIUM: 141 mmol/L (ref 135–145)

## 2015-10-21 LAB — URINE MICROSCOPIC-ADD ON: RBC / HPF: NONE SEEN RBC/hpf (ref 0–5)

## 2015-10-21 LAB — URINALYSIS, ROUTINE W REFLEX MICROSCOPIC
Bilirubin Urine: NEGATIVE
GLUCOSE, UA: NEGATIVE mg/dL
HGB URINE DIPSTICK: NEGATIVE
Ketones, ur: 15 mg/dL — AB
Nitrite: NEGATIVE
PH: 8 (ref 5.0–8.0)
Protein, ur: NEGATIVE mg/dL
SPECIFIC GRAVITY, URINE: 1.014 (ref 1.005–1.030)

## 2015-10-21 LAB — I-STAT CG4 LACTIC ACID, ED: Lactic Acid, Venous: 1.16 mmol/L (ref 0.5–2.0)

## 2015-10-21 LAB — CK: Total CK: 140 U/L (ref 38–234)

## 2015-10-21 MED ORDER — SODIUM CHLORIDE 0.9 % IV BOLUS (SEPSIS)
1000.0000 mL | Freq: Once | INTRAVENOUS | Status: AC
  Administered 2015-10-21: 1000 mL via INTRAVENOUS

## 2015-10-21 MED ORDER — LEVOFLOXACIN 750 MG PO TABS
750.0000 mg | ORAL_TABLET | Freq: Once | ORAL | Status: AC
  Administered 2015-10-21: 750 mg via ORAL
  Filled 2015-10-21: qty 1

## 2015-10-21 MED ORDER — ONDANSETRON 4 MG PO TBDP
4.0000 mg | ORAL_TABLET | Freq: Once | ORAL | Status: AC
  Administered 2015-10-21: 4 mg via ORAL

## 2015-10-21 MED ORDER — IBUPROFEN 800 MG PO TABS
800.0000 mg | ORAL_TABLET | Freq: Once | ORAL | Status: AC
  Administered 2015-10-21: 800 mg via ORAL
  Filled 2015-10-21: qty 1

## 2015-10-21 MED ORDER — ACETAMINOPHEN 325 MG PO TABS
325.0000 mg | ORAL_TABLET | Freq: Once | ORAL | Status: AC
  Administered 2015-10-21: 325 mg via ORAL
  Filled 2015-10-21: qty 1

## 2015-10-21 MED ORDER — ACETAMINOPHEN 325 MG PO TABS
650.0000 mg | ORAL_TABLET | Freq: Once | ORAL | Status: AC
  Administered 2015-10-21: 650 mg via ORAL
  Filled 2015-10-21: qty 2

## 2015-10-21 MED ORDER — LEVOFLOXACIN 500 MG PO TABS: 500.0000 mg | ORAL_TABLET | Freq: Every day | ORAL | Status: DC

## 2015-10-21 MED ORDER — ONDANSETRON 4 MG PO TBDP
ORAL_TABLET | ORAL | Status: AC
  Filled 2015-10-21: qty 1

## 2015-10-21 NOTE — Discharge Instructions (Signed)
Tylenol, or Motrin for fever. Follow-up with your primary care physician. Rest, push fluids, stay hydrated. Return to ER with worsening.   Community-Acquired Pneumonia, Adult Pneumonia is an infection of the lungs. One type of pneumonia can happen while a person is in a hospital. A different type can happen when a person is not in a hospital (community-acquired pneumonia). It is easy for this kind to spread from person to person. It can spread to you if you breathe near an infected person who coughs or sneezes. Some symptoms include:  A dry cough.  A wet (productive) cough.  Fever.  Sweating.  Chest pain. HOME CARE  Take over-the-counter and prescription medicines only as told by your doctor.  Only take cough medicine if you are losing sleep.  If you were prescribed an antibiotic medicine, take it as told by your doctor. Do not stop taking the antibiotic even if you start to feel better.  Sleep with your head and neck raised (elevated). You can do this by putting a few pillows under your head, or you can sleep in a recliner.  Do not use tobacco products. These include cigarettes, chewing tobacco, and e-cigarettes. If you need help quitting, ask your doctor.  Drink enough water to keep your pee (urine) clear or pale yellow. A shot (vaccine) can help prevent pneumonia. Shots are often suggested for:  People older than 67 years of age.  People older than 67 years of age:  Who are having cancer treatment.  Who have long-term (chronic) lung disease.  Who have problems with their body's defense system (immune system). You may also prevent pneumonia if you take these actions:  Get the flu (influenza) shot every year.  Go to the dentist as often as told.  Wash your hands often. If soap and water are not available, use hand sanitizer. GET HELP IF:  You have a fever.  You lose sleep because your cough medicine does not help. GET HELP RIGHT AWAY IF:  You are short of  breath and it gets worse.  You have more chest pain.  Your sickness gets worse. This is very serious if:  You are an older adult.  Your body's defense system is weak.  You cough up blood.   This information is not intended to replace advice given to you by your health care provider. Make sure you discuss any questions you have with your health care provider.   Document Released: 04/04/2008 Document Revised: 07/08/2015 Document Reviewed: 02/11/2015 Elsevier Interactive Patient Education [employer]2016 [employer] Inc.

## 2015-10-21 NOTE — ED Provider Notes (Signed)
CSN: [id number]     Arrival date & time 10/21/15  1720 History  By signing my name below, I, Evon Slack, attest that this documentation has been prepared under the direction and in the presence of Rolland Porter, MD. Electronically Signed: Evon Slack, ED Scribe. 10/21/2015. 10:50 PM.      Chief Complaint  Patient presents with  . Generalized Body Aches   The history is provided by the patient. No language interpreter was used.   HPI Comments: Donna Frank is a 69 y.o. female who presents to the Emergency Department complaining of generalized myalgia onset 1 week prior. Pt states that she was recently Dx with sinus infection about 2 weeks prior. Pt states that she was sent home on prednisone taper for 7 days and the following day her new symptoms began. Pt is complaining of rhinorrhea, chills, ear pain and HA. She also reports slight weakness but states that she has a HX of neuropathy. Pt also reports nausea and vomiting that began 1 day prior. Pt states that her HA is located behind her eyes. Pt states that her ear pain is worse when moving her jaw. Pt states that she has tried tylenol with no relief. Pt denies cough, SOB, abdominal pain, urinary frequency or dysuria.    Past Medical History  Diagnosis Date  . Hypertension   . Vertigo   . Hyperlipidemia   . Pinched nerve in neck     neck and shoulder  . Asthma   . Reflux   . Hiatal hernia   . Pre-diabetes   . Diabetes mellitus without complication [facility])    Past Surgical History  Procedure Laterality Date  . Abdominal hysterectomy     Family History  Problem Relation Age of Onset  . Emphysema Maternal Grandfather   . COPD Mother   . Heart disease Maternal Uncle   . Heart disease Maternal Aunt   . Rheum arthritis Sister    Social History  Substance Use Topics  . Smoking status: Former Smoker -- 0.50 packs/day for 35 years    Quit date: 12/21/2012  . Smokeless tobacco: Never Used  . Alcohol Use: No   OB History     No data available     Review of Systems  Constitutional: Positive for fever and chills. Negative for diaphoresis, appetite change and fatigue.  HENT: Positive for ear pain and rhinorrhea. Negative for mouth sores, sore throat and trouble swallowing.   Eyes: Negative for visual disturbance.  Respiratory: Negative for cough, chest tightness, shortness of breath and wheezing.   Cardiovascular: Negative for chest pain.  Gastrointestinal: Positive for nausea and vomiting. Negative for abdominal pain, diarrhea and abdominal distention.  Endocrine: Negative for polydipsia, polyphagia and polyuria.  Genitourinary: Negative for dysuria, frequency and hematuria.  Musculoskeletal: Negative for gait problem.  Skin: Negative for color change, pallor and rash.  Neurological: Positive for headaches. Negative for dizziness, syncope and light-headedness.  Hematological: Does not bruise/bleed easily.  Psychiatric/Behavioral: Negative for behavioral problems and confusion.      Allergies  Codeine; Iodine; Penicillins; Prednisone; Sulfa antibiotics; and Vicodin  Home Medications   Prior to Admission medications   Medication Sig Start Date End Date Taking? Authorizing Provider  albuterol (PROVENTIL) (2.5 MG/3ML) 0.083% nebulizer solution Take 2.5 mg by nebulization every 6 (six) hours as needed for wheezing or shortness of breath.    Historical Provider, MD  ALPRAZolam Prudy Feeler) 0.25 MG tablet Take 1 tablet (0.25 mg total) by mouth 3 (three) times daily as  needed for anxiety. 03/08/13   John Molpus, MD  amLODipine (NORVASC) 5 MG tablet Take 5 mg by mouth daily.    Historical Provider, MD  aspirin 81 MG chewable tablet Chew 4 tablets (324 mg total) by mouth daily. 12/20/14   Gerhard Munch, MD  beclomethasone (QVAR) 40 MCG/ACT inhaler Inhale 2 puffs into the lungs 2 (two) times daily.    Historical Provider, MD  cetirizine (ZYRTEC) 10 MG tablet Take 10 mg by mouth daily.    Historical Provider, MD   cholecalciferol (VITAMIN D) 1000 UNITS tablet Take 1,000 Units by mouth daily.    Historical Provider, MD  cyclobenzaprine (FLEXERIL) 5 MG tablet Take 1 tablet (5 mg total) by mouth 2 (two) times daily as needed for muscle spasms. 04/08/15   Richardean Canal, MD  EPINEPHrine (EPIPEN 2-PAK) 0.3 mg/0.3 mL IJ SOAJ injection Inject 0.3 mLs (0.3 mg total) into the muscle once. 10/06/15   Fletcher Anon, MD  esomeprazole (NEXIUM) 40 MG capsule Take 40 mg by mouth daily before breakfast.    Historical Provider, MD  ibuprofen (ADVIL,MOTRIN) 600 MG tablet Take 1 tablet (600 mg total) by mouth every 8 (eight) hours as needed. 11/04/14   Azalia Bilis, MD  levofloxacin (LEVAQUIN) 500 MG tablet Take 1 tablet (500 mg total) by mouth daily. 10/21/15   Rolland Porter, MD  lisinopril (PRINIVIL,ZESTRIL) 10 MG tablet Take 30 mg by mouth daily.     Historical Provider, MD  losartan (COZAAR) 50 MG tablet Take 1 tablet (50 mg total) by mouth daily. 09/11/14   Oretha Milch, MD  meclizine (ANTIVERT) 25 MG tablet Take 25 mg by mouth 3 (three) times daily as needed.    Historical Provider, MD  metFORMIN (GLUCOPHAGE) 500 MG tablet Take 500 mg by mouth. Half tab with meal at evening    Historical Provider, MD  methocarbamol (ROBAXIN) 500 MG tablet Take 1 tablet (500 mg total) by mouth every 8 (eight) hours as needed for muscle spasms. 11/04/14   Azalia Bilis, MD  montelukast (SINGULAIR) 10 MG tablet Take 10 mg by mouth as needed.    Historical Provider, MD  pravastatin (PRAVACHOL) 20 MG tablet Take 40 mg by mouth daily.     Historical Provider, MD   BP 102/59 mmHg  Pulse 107  Temp(Src) 99.2 F (37.3 C) (Oral)  Resp 16  Ht  (1.626 m)  Wt 172 lb (78.019 kg)  BMI 29.51 kg/m2  SpO2 96%   Physical Exam  Constitutional: She is oriented to person, place, and time. She appears well-developed and well-nourished. No distress.  HENT:  Head: Normocephalic.  Right Ear: Tympanic membrane is not erythematous. A middle ear effusion is  present.  Left Ear: Tympanic membrane is not erythematous. A middle ear effusion is present.  Eyes: Conjunctivae are normal. Pupils are equal, round, and reactive to light. No scleral icterus.  Neck: Normal range of motion. Neck supple. No thyromegaly present.  Cardiovascular: Regular rhythm.  Tachycardia present.  Exam reveals no gallop and no friction rub.   No murmur heard. Pulmonary/Chest: Effort normal and breath sounds normal. No respiratory distress. She has no wheezes. She has no rales.  Abdominal: Soft. Bowel sounds are normal. She exhibits no distension. There is no tenderness. There is no rebound.  Musculoskeletal: Normal range of motion.  Neurological: She is alert and oriented to person, place, and time.  Skin: Skin is warm and dry. No rash noted.  Psychiatric: She has a normal mood and affect.  Her behavior is normal.    ED Course  Procedures (including critical care time) DIAGNOSTIC STUDIES: Oxygen Saturation is 99% on RA, normal by my interpretation.    COORDINATION OF CARE: 10:50 PM-Discussed treatment plan with pt at bedside and pt agreed to plan.     Labs Review Labs Reviewed  CBC WITH DIFFERENTIAL/PLATELET - Abnormal; Notable for the following:    WBC 13.2 (*)    RDW 16.6 (*)    Neutro Abs 9.0 (*)    All other components within normal limits  BASIC METABOLIC PANEL - Abnormal; Notable for the following:    Creatinine, Ser 1.01 (*)    GFR calc non Af Amer 57 (*)    All other components within normal limits  URINALYSIS, ROUTINE W REFLEX MICROSCOPIC (NOT AT [facility]) - Abnormal; Notable for the following:    APPearance CLOUDY (*)    Ketones, ur 15 (*)    Leukocytes, UA SMALL (*)    All other components within normal limits  URINE MICROSCOPIC-ADD ON - Abnormal; Notable for the following:    Squamous Epithelial / LPF 0-5 (*)    Bacteria, UA MANY (*)    All other components within normal limits  CK  I-STAT CG4 LACTIC ACID, ED    Imaging Review Dg Chest 2  View  10/21/2015  CLINICAL DATA:  Generalized body aches, recent sinus infection, myalgias, fever EXAM: CHEST  2 VIEW COMPARISON:  06/03/2015 FINDINGS: Mild peripheral left basilar streaky bronchovascular opacity could represent atelectasis versus early bronchopneumonia. This partially obscures the hemidiaphragm. No associated effusion. Right lung remains clear. No edema or pneumothorax. Normal heart size and vascularity. Degenerative changes of the spine. IMPRESSION: Left basilar atelectasis versus developing pneumonia. Electronically Signed   By: Judie PetitM.  Shick M.D.   On: 10/21/2015 19:14      EKG Interpretation None      MDM   Final diagnoses:  CAP (community acquired pneumonia)     IV placed. Patient given fluids. Given by mouth Tylenol and Motrin. Chest x-ray shows subtle basilar pneumonia. Leukocytosis. Cranium 1.01. Normal lactate. Heart rate improves. Temperature improved. Remains well oxygenated 97% on room air. Given by mouth Tylenol. She is appropriate for discharge home. Asked, push fluids dehydrated. Fever control. Primary care follow-up. ER with acute changes.     Rolland PorterMark Lysander Calixte, MD 10/21/15 2250

## 2015-10-21 NOTE — ED Notes (Signed)
Pt reports having generalized bodyaches for past few days.  Reports about 1 week ago dx with sinus infection placed on steriods and now has generalized body aches all over.

## 2015-10-21 NOTE — ED Notes (Signed)
Pt states feels tightness in head and dizziness,  md notified

## 2015-11-01 HISTORY — PX: CERVICAL DISC SURGERY: SHX588

## 2016-02-04 ENCOUNTER — Other Ambulatory Visit: Payer: Self-pay

## 2016-02-26 ENCOUNTER — Ambulatory Visit: Payer: Medicare Other | Admitting: Pediatrics

## 2016-12-12 ENCOUNTER — Ambulatory Visit: Payer: Medicaid Other | Admitting: Pediatrics

## 2017-01-28 ENCOUNTER — Emergency Department (HOSPITAL_BASED_OUTPATIENT_CLINIC_OR_DEPARTMENT_OTHER): Payer: Medicare Other

## 2017-01-28 ENCOUNTER — Emergency Department (HOSPITAL_BASED_OUTPATIENT_CLINIC_OR_DEPARTMENT_OTHER)
Admission: EM | Admit: 2017-01-28 | Discharge: 2017-01-28 | Disposition: A | Discharge status: Home / Self Care | Payer: Medicare Other | Attending: Emergency Medicine | Admitting: Emergency Medicine

## 2017-01-28 ENCOUNTER — Encounter (HOSPITAL_BASED_OUTPATIENT_CLINIC_OR_DEPARTMENT_OTHER): Payer: Self-pay | Admitting: Emergency Medicine

## 2017-01-28 DIAGNOSIS — E119 Type 2 diabetes mellitus without complications: Secondary | ICD-10-CM | POA: Diagnosis not present

## 2017-01-28 DIAGNOSIS — I10 Essential (primary) hypertension: Secondary | ICD-10-CM | POA: Diagnosis not present

## 2017-01-28 DIAGNOSIS — R102 Pelvic and perineal pain: Secondary | ICD-10-CM | POA: Diagnosis not present

## 2017-01-28 DIAGNOSIS — Z79899 Other long term (current) drug therapy: Secondary | ICD-10-CM | POA: Diagnosis not present

## 2017-01-28 DIAGNOSIS — J45909 Unspecified asthma, uncomplicated: Secondary | ICD-10-CM | POA: Insufficient documentation

## 2017-01-28 DIAGNOSIS — M79604 Pain in right leg: Secondary | ICD-10-CM | POA: Insufficient documentation

## 2017-01-28 DIAGNOSIS — Z87891 Personal history of nicotine dependence: Secondary | ICD-10-CM | POA: Insufficient documentation

## 2017-01-28 DIAGNOSIS — Z7982 Long term (current) use of aspirin: Secondary | ICD-10-CM | POA: Diagnosis not present

## 2017-01-28 DIAGNOSIS — Z7984 Long term (current) use of oral hypoglycemic drugs: Secondary | ICD-10-CM | POA: Insufficient documentation

## 2017-01-28 LAB — URINALYSIS, ROUTINE W REFLEX MICROSCOPIC
Bilirubin Urine: NEGATIVE
GLUCOSE, UA: NEGATIVE mg/dL
Hgb urine dipstick: NEGATIVE
Ketones, ur: NEGATIVE mg/dL
Nitrite: NEGATIVE
PH: 6 (ref 5.0–8.0)
PROTEIN: NEGATIVE mg/dL
SPECIFIC GRAVITY, URINE: 1.025 (ref 1.005–1.030)

## 2017-01-28 LAB — URINALYSIS, MICROSCOPIC (REFLEX)
RBC / HPF: NONE SEEN RBC/hpf (ref 0–5)
SQUAMOUS EPITHELIAL / LPF: NONE SEEN

## 2017-01-28 MED ORDER — ACETAMINOPHEN 500 MG PO TABS
1000.0000 mg | ORAL_TABLET | Freq: Once | ORAL | Status: AC
  Administered 2017-01-28: 1000 mg via ORAL

## 2017-01-28 MED ORDER — ACETAMINOPHEN 500 MG PO TABS
ORAL_TABLET | ORAL | Status: AC
  Filled 2017-01-28: qty 2

## 2017-01-28 MED ORDER — TRAMADOL HCL 50 MG PO TABS: 50.0000 mg | ORAL_TABLET | Freq: Four times a day (QID) | ORAL | 0 refills | Status: DC | PRN

## 2017-01-28 NOTE — ED Provider Notes (Signed)
[facility] Note   CSN: [id number] Arrival date & time: 01/28/17  1824  By signing my name below, I, Cynda Acres, attest that this documentation has been prepared under the direction and in the presence of Geoffery Lyons, MD. Electronically Signed: Cynda Acres, Scribe. 01/28/17. 7:23 PM.  History   Chief Complaint Chief Complaint  Patient presents with  . Leg Pain    right    HPI Comments: Donna Frank is a 71 y.o. female with a history of sciatica who presents to the Emergency Department complaining of sudden-onset, constant right-sided pelvic pain that began a few days ago. Patient states she recently had steroid injections in her right knee. Patient states her pain is different than her chronic knee pain. Patient reports a fall 4 months ago, in which she went to her primary physician. Patient reports associated radiation down the left thigh. Patient describes her pain as throbbing, with a severity of 5/10. Patient reports tylenol with no relief. Patient is ambulatory in the emergency department. Patient denies any nausea, vomiting, weakness, tinging, or loss of bowel/bladder control.   The history is provided by the patient. No language interpreter was used.    Past Medical History:  Diagnosis Date  . Asthma   . Diabetes mellitus without complication (HCC)   . Hiatal hernia   . Hyperlipidemia   . Hypertension   . Pinched nerve in neck    neck and shoulder  . Pre-diabetes   . Reflux   . Vertigo     Patient Active Problem List   Diagnosis Date Noted  . Diabetic neuropathy, type II diabetes mellitus (HCC) 01/27/2015  . Extrinsic asthma 09/11/2014  . Hammer toe, acquired 11/27/2013  . Unspecified hereditary and idiopathic peripheral neuropathy 07/23/2013  . Bunion of great toe 07/23/2013    Past Surgical History:  Procedure Laterality Date  . ABDOMINAL HYSTERECTOMY     partial  . CERVICAL DISC SURGERY  2017    OB History    No data available        Home Medications    Prior to Admission medications   Medication Sig Start Date End Date Taking? Authorizing Provider  albuterol (PROVENTIL) (2.5 MG/3ML) 0.083% nebulizer solution Take 2.5 mg by nebulization every 6 (six) hours as needed for wheezing or shortness of breath.   Yes Historical Provider, MD  Alcaftadine (LASTACAFT) 0.25 % SOLN Apply to eye.   Yes Historical Provider, MD  ALPRAZolam (XANAX) 0.25 MG tablet Take 1 tablet (0.25 mg total) by mouth 3 (three) times daily as needed for anxiety. 03/08/13  Yes Paula Libra, MD  aspirin 81 MG chewable tablet Chew 4 tablets (324 mg total) by mouth daily. 12/20/14  Yes Gerhard Munch, MD  cholecalciferol (VITAMIN D) 1000 UNITS tablet Take 1,000 Units by mouth daily.   Yes Historical Provider, MD  DULoxetine (CYMBALTA) 60 MG capsule Take 60 mg by mouth daily.   Yes Historical Provider, MD  gabapentin (NEURONTIN) 100 MG capsule Take 100 mg by mouth 3 (three) times daily.   Yes Historical Provider, MD  lisinopril (PRINIVIL,ZESTRIL) 10 MG tablet Take 40 mg by mouth daily.    Yes Historical Provider, MD  pantoprazole (PROTONIX) 40 MG tablet Take 40 mg by mouth daily.   Yes Historical Provider, MD  EPINEPHrine (EPIPEN 2-PAK) 0.3 mg/0.3 mL IJ SOAJ injection Inject 0.3 mLs (0.3 mg total) into the muscle once. 10/06/15   Fletcher Anon, MD  meclizine (ANTIVERT) 25 MG tablet Take 25 mg by  mouth 3 (three) times daily as needed.    Historical Provider, MD  metFORMIN (GLUCOPHAGE) 500 MG tablet Take 500 mg by mouth. Half tab with meal at evening    Historical Provider, MD    Family History Family History  Problem Relation Age of Onset  . Emphysema Maternal Grandfather   . COPD Mother   . Heart disease Maternal Uncle   . Heart disease Maternal Aunt   . Rheum arthritis Sister     Social History Social History  Substance Use Topics  . Smoking status: Former Smoker    Packs/day: 0.50    Years: 35.00    Quit date: 12/21/2008  . Smokeless  tobacco: Never Used  . Alcohol use No     Allergies   Codeine; Iodine; Penicillins; Prednisone; Sulfa antibiotics; and Vicodin [hydrocodone-acetaminophen]   Review of Systems Review of Systems  Constitutional: Negative for fever.  Gastrointestinal: Negative for nausea and vomiting.  Genitourinary: Positive for pelvic pain (left-sided).  Musculoskeletal: Positive for arthralgias (left thigh).  Neurological: Negative for weakness and numbness.  All other systems reviewed and are negative.    Physical Exam Updated Vital Signs BP (!) 131/94 (BP Location: Right Arm)   Pulse 90   Temp 98.8 F (37.1 C) (Oral)   Resp 17   Ht  (1.575 m)   Wt 174 lb (78.9 kg)   SpO2 100%   BMI 31.83 kg/m   Physical Exam  Constitutional: She is oriented to person, place, and time. She appears well-developed and well-nourished. No distress.  HENT:  Head: Normocephalic and atraumatic.  Eyes: EOM are normal.  Neck: Normal range of motion.  Cardiovascular: Normal rate, regular rhythm and normal heart sounds.   Pulmonary/Chest: Effort normal and breath sounds normal.  Abdominal: Soft. She exhibits no distension. There is no tenderness.  TTP in the RLQ and groin. No palpable hernia or lymph adenopathy.   Musculoskeletal: Normal range of motion. She exhibits tenderness. She exhibits no edema or deformity.  Full ROM of the entire hip and leg. Distal PMS intact.   Neurological: She is alert and oriented to person, place, and time.  Skin: Skin is warm and dry.  Psychiatric: She has a normal mood and affect. Judgment normal.  Nursing note and vitals reviewed.    ED Treatments / Results  DIAGNOSTIC STUDIES: Oxygen Saturation is 100% on RA, normal by my interpretation.    COORDINATION OF CARE: 7:26 PM Discussed treatment plan with pt at bedside and pt agreed to plan, which includes an abdominal CT.   Labs (all labs ordered are listed, but only abnormal results are displayed) Labs Reviewed    URINALYSIS, ROUTINE W REFLEX MICROSCOPIC    EKG  EKG Interpretation None       Radiology No results found.  Procedures Procedures (including critical care time)  Medications Ordered in ED Medications - No data to display   Initial Impression / Assessment and Plan / ED Course  I have reviewed the triage vital signs and the nursing notes.  Pertinent labs & imaging results that were available during my care of the patient were reviewed by me and considered in my medical decision making (see chart for details).  CT scan shows no evidence for pelvic or intra-abdominal pathology. Her symptoms are most likely related to a muscle strain or nerve impingement. She will be treated with pain medication and is to follow-up with her primary Dr. if she is not improving.  Final Clinical Impressions(s) / ED Diagnoses  Final diagnoses:  None    New Prescriptions New Prescriptions   No medications on file   I personally performed the services described in this documentation, which was scribed in my presence. The recorded information has been reviewed and is accurate.        Geoffery Lyons, MD 01/28/17 [phone]

## 2017-01-28 NOTE — ED Triage Notes (Signed)
Patient c/o right pelvic pain that radiates down to her calf, pain is achy and throbbing in nature. Denies urinary symptoms and reports a partial hysterectomy. Patient is ambulatory with a cane, and states she is need of a knee replacement. Patient treating pain with tylenol with mild relief.

## 2017-01-28 NOTE — Discharge Instructions (Signed)
Tramadol as prescribed as needed for pain. ° °Follow-up with your primary Dr. if not improving in the next week, and return to the ER if symptoms significantly worsen or change. °

## 2017-10-31 HISTORY — PX: KNEE SURGERY: SHX244

## 2017-12-05 ENCOUNTER — Ambulatory Visit: Payer: Medicare Other | Admitting: Podiatry

## 2018-01-24 ENCOUNTER — Ambulatory Visit: Payer: Medicare Other | Admitting: Podiatry

## 2018-02-08 ENCOUNTER — Ambulatory Visit: Payer: Medicare Other | Admitting: Podiatry

## 2018-10-22 ENCOUNTER — Ambulatory Visit: Payer: Medicare Other | Admitting: Pediatrics

## 2018-11-12 ENCOUNTER — Ambulatory Visit: Payer: Medicare Other | Admitting: Pediatrics

## 2018-12-03 ENCOUNTER — Ambulatory Visit (INDEPENDENT_AMBULATORY_CARE_PROVIDER_SITE_OTHER): Payer: Medicare Other | Admitting: Pediatrics

## 2018-12-03 ENCOUNTER — Encounter: Payer: Self-pay | Admitting: Pediatrics

## 2018-12-03 VITALS — BP 112/76 | HR 85 | Temp 98.2°F | Resp 16 | Ht 63.0 in | Wt 186.0 lb

## 2018-12-03 DIAGNOSIS — J3089 Other allergic rhinitis: Secondary | ICD-10-CM

## 2018-12-03 DIAGNOSIS — T63481D Toxic effect of venom of other arthropod, accidental (unintentional), subsequent encounter: Secondary | ICD-10-CM

## 2018-12-03 DIAGNOSIS — J302 Other seasonal allergic rhinitis: Secondary | ICD-10-CM | POA: Insufficient documentation

## 2018-12-03 DIAGNOSIS — J4530 Mild persistent asthma, uncomplicated: Secondary | ICD-10-CM | POA: Diagnosis not present

## 2018-12-03 DIAGNOSIS — T7800XA Anaphylactic reaction due to unspecified food, initial encounter: Secondary | ICD-10-CM | POA: Insufficient documentation

## 2018-12-03 DIAGNOSIS — T7800XD Anaphylactic reaction due to unspecified food, subsequent encounter: Secondary | ICD-10-CM

## 2018-12-03 DIAGNOSIS — Z8669 Personal history of other diseases of the nervous system and sense organs: Secondary | ICD-10-CM | POA: Insufficient documentation

## 2018-12-03 DIAGNOSIS — R7303 Prediabetes: Secondary | ICD-10-CM | POA: Insufficient documentation

## 2018-12-03 DIAGNOSIS — I10 Essential (primary) hypertension: Secondary | ICD-10-CM

## 2018-12-03 DIAGNOSIS — T63481A Toxic effect of venom of other arthropod, accidental (unintentional), initial encounter: Secondary | ICD-10-CM | POA: Insufficient documentation

## 2018-12-03 DIAGNOSIS — K219 Gastro-esophageal reflux disease without esophagitis: Secondary | ICD-10-CM | POA: Insufficient documentation

## 2018-12-03 MED ORDER — FLUTICASONE PROPIONATE HFA 110 MCG/ACT IN AERO: 1.0000 | INHALATION_SPRAY | Freq: Two times a day (BID) | RESPIRATORY_TRACT | 5 refills | Status: DC

## 2018-12-03 MED ORDER — EPINEPHRINE 0.3 MG/0.3ML IJ SOAJ: INTRAMUSCULAR | 1 refills | Status: DC

## 2018-12-03 MED ORDER — MONTELUKAST SODIUM 10 MG PO TABS: 10.0000 mg | ORAL_TABLET | Freq: Every day | ORAL | 5 refills | Status: DC

## 2018-12-03 NOTE — Patient Instructions (Addendum)
Environmental control of dust mite Zyrtec 10 mg-take 1 tablet once a day for runny nose or itchy eyes Fluticasone 2 sprays per nostril once a day if needed for stuffy nose Montelukast 10 mg-take 1 tablet once a day to prevent coughing or wheezing Flovent 110-1 puff twice a day to prevent coughing or wheezing Pro-air 2 puffs every 4 hours if needed for wheezing or coughing spells.  You may use Pro-air 2 puffs 5 to 15 minutes before exercise Add prednisone 10 mg twice a day for 4 days, 10 mg in the fifth day to bring the asthma under control Continue on your other medications Call us if you are not doing well on this treatment plan  Avoid shellfish.  If you have an allergic reaction from shellfish or an insect sting, take Benadryl 50 mg  every 4 hours and if you have life-threatening symptoms inject with EpiPen 0.3 mg

## 2018-12-03 NOTE — Progress Notes (Addendum)
[address] [place] [address] Dept: [phone]  New Patient Note  Patient ID: Donna Frank, female    DOB: July 14, 1946  Age: 73 y.o. MRN: [id number] Date of Office Visit: 12/03/2018 Referring provider: Bailey Mech, PA-C No address on file    Chief Complaint: Nasal Congestion  HPI Donna Frank presents for evaluation of persistent nasal congestion for several years.  She has aggravation of her symptoms on exposure to dust, cigarette smoke, cats and weather changes.  She has a history of asthma and has some coughing and wheezing when she goes outside or when she is exposed to perfumes or colognes.  She has not used Flovent  for several months.  She has been having some coughing spells.  She has had hives from shellfish.  She has a history of eczema.  She has had pneumonia twice in the past.  She has gastroesophageal reflux.  She does not have a history of recurrent sinus infections.  She has a history of swelling and itching from an insect sting 10 years ago with a history of low reactivity to yellow hornet, white hornet ,yellow jacket  Review of Systems  Constitutional: Negative.   HENT:       Allergic rhinitis for several years  Eyes:       Glaucoma and one lens implant  Respiratory:       Asthma for several years  Cardiovascular:       Hypertension  Gastrointestinal:       Gastroesophageal reflux  Musculoskeletal:       1 total knee replacement.  Osteoarthritis  Skin:       History of eczema  Neurological:       Cervical fusion  Endo/Heme/Allergies:       Prediabetes.  No thyroid problems.  Allergy to insect venoms  Psychiatric/Behavioral: Negative.     Outpatient Encounter Medications as of 12/03/2018  Medication Sig  . acetaminophen (TYLENOL) 325 MG tablet Take by mouth.  Marland Kitchen albuterol (PROAIR HFA) 108 (90 Base) MCG/ACT inhaler Inhale into the lungs every 6 (six) hours as needed for wheezing or shortness of breath.  Marland Kitchen albuterol (PROVENTIL) (2.5  MG/3ML) 0.083% nebulizer solution Take 2.5 mg by nebulization every 6 (six) hours as needed for wheezing or shortness of breath.  . Alcaftadine (LASTACAFT) 0.25 % SOLN Apply to eye.  Marland Kitchen aspirin 81 MG chewable tablet Chew 4 tablets (324 mg total) by mouth daily.  . cholecalciferol (VITAMIN D) 1000 UNITS tablet Take 1,000 Units by mouth daily.  . clobetasol (TEMOVATE) 0.05 % external solution 1(ONE) APPLICATION(S) SCALP 2(TWO) TIMES A DAY  . diazepam (VALIUM) 2 MG tablet TAKE 1 TABLET BY MOUTH 2 TIMES DAILY AS NEEDED FOR ANXIETY.  . DULoxetine (CYMBALTA) 60 MG capsule Take 60 mg by mouth daily.  . Esomeprazole Magnesium (NEXIUM PO) Take by mouth.  Marland Kitchen lisinopril (PRINIVIL,ZESTRIL) 10 MG tablet Take 40 mg by mouth daily.   . meclizine (ANTIVERT) 25 MG tablet Take 25 mg by mouth 3 (three) times daily as needed.  . metFORMIN (GLUCOPHAGE) 500 MG tablet Take 500 mg by mouth. Half tab with meal at evening  . [DISCONTINUED] fluticasone (FLOVENT HFA) 44 MCG/ACT inhaler Inhale into the lungs.  Marland Kitchen EPINEPHrine (EPIPEN 2-PAK) 0.3 mg/0.3 mL IJ SOAJ injection Use for severe allergic reactions  . fluticasone (FLOVENT HFA) 110 MCG/ACT inhaler Inhale 1 puff into the lungs 2 (two) times daily. To prevent coughing or wheezing.  . montelukast (SINGULAIR) 10 MG tablet Take 1 tablet (  10 mg total) by mouth at bedtime. To prevent coughing or wheezing.  . [DISCONTINUED] ALPRAZolam (XANAX) 0.25 MG tablet Take 1 tablet (0.25 mg total) by mouth 3 (three) times daily as needed for anxiety.  . [DISCONTINUED] EPINEPHrine (EPIPEN 2-PAK) 0.3 mg/0.3 mL IJ SOAJ injection Inject 0.3 mLs (0.3 mg total) into the muscle once. (Patient not taking: Reported on 12/03/2018)  . [DISCONTINUED] gabapentin (NEURONTIN) 100 MG capsule Take 100 mg by mouth 3 (three) times daily.  . [DISCONTINUED] pantoprazole (PROTONIX) 40 MG tablet Take 40 mg by mouth daily.  . [DISCONTINUED] traMADol (ULTRAM) 50 MG tablet Take 1 tablet (50 mg total) by mouth every 6  (six) hours as needed.   No facility-administered encounter medications on file as of 12/03/2018.      Drug Allergies:  Allergies  Allergen Reactions  . Naproxen Sodium Nausea And Vomiting and Palpitations    Acid reflux  . Shellfish Allergy Itching and Swelling  . Codeine   . Iodine Itching  . Penicillins Nausea Only  . Prednisone Nausea And Vomiting    Only allergic to pink pills. Can take injection ok.   . Sulfa Antibiotics   . Vicodin [Hydrocodone-Acetaminophen] Nausea And Vomiting    "I don't want none of that"   . Levofloxacin Itching and Other (See Comments)    Insomnia Insomnia, itching, myalgia     Family History: Lazara's family history includes Allergic rhinitis in her granddaughter and grandson; Asthma in her granddaughter and grandson; COPD in her mother; Emphysema in her maternal grandfather; Heart disease in her maternal aunt and maternal uncle; Lupus in her niece; Rheum arthritis in her sister..  Family history is positive for eczema but negative for chronic hives or food allergies or angioedema.  Social and environmental.  There are no pets in the home.  She is not exposed to cigarette smoking.  She used to smoke cigarettes for about 35 years  Physical Exam: BP 112/76   Pulse 85   Temp 98.2 F (36.8 C) (Oral)   Resp 16   Ht 5\' 3"  (1.6 m) Comment: with shoes  Wt 186 lb (84.4 kg) Comment: with shoes  SpO2 96%   BMI 32.95 kg/m    Physical Exam Vitals signs reviewed.  Constitutional:      Appearance: Normal appearance. She is obese.  HENT:     Head:     Comments: Eyes normal.  Ears normal.  Moderate swelling of the nasal turbinates.  Pharynx normal. Neck:     Musculoskeletal: Neck supple.     Comments: No thyromegaly Cardiovascular:     Comments: S1 S2 normal no murmurs Pulmonary:     Comments: Clear to percussion and auscultation Abdominal:     Palpations: Abdomen is soft.     Tenderness: There is no abdominal tenderness.     Comments: No  hepatosplenomegaly  Lymphadenopathy:     Cervical: No cervical adenopathy.  Skin:    Comments: Clear  Neurological:     General: No focal deficit present.     Mental Status: She is alert and oriented to person, place, and time.  Psychiatric:        Mood and Affect: Mood normal.        Behavior: Behavior normal.        Thought Content: Thought content normal.        Judgment: Judgment normal.     Diagnostics: Allergy skin test were positive to dust mite, cat, cockroach ro.  Mild reactions noted to grass  pollens , weeds and tree pollens.  She had positive skin test to shrimp and crab  Forced vital capacity 1.53 L FEV1 1.24 L.  Predicted forced vital capacity 2.26 L predicted FEV1 1.75 L.  After albuterol 2 puffs 4 cell capacity 1.71 L FEV1 1.32 L.-This shows a mild reduction in the forced vital capacity with some improvement after albuterol   Assessment  Assessment and Plan: 1. Mild persistent asthma without complication   2. Other allergic rhinitis   3. Anaphylactic shock due to food, subsequent encounter   4. Gastroesophageal reflux disease without esophagitis   5. Essential hypertension   6. Prediabetes   7. History of glaucoma   8. Allergic reaction to insect sting, accidental or unintentional, subsequent encounter     Meds ordered this encounter  Medications  . fluticasone (FLOVENT HFA) 110 MCG/ACT inhaler    Sig: Inhale 1 puff into the lungs 2 (two) times daily. To prevent coughing or wheezing.    Dispense:  1 Inhaler    Refill:  5  . montelukast (SINGULAIR) 10 MG tablet    Sig: Take 1 tablet (10 mg total) by mouth at bedtime. To prevent coughing or wheezing.    Dispense:  30 tablet    Refill:  5  . EPINEPHrine (EPIPEN 2-PAK) 0.3 mg/0.3 mL IJ SOAJ injection    Sig: Use for severe allergic reactions    Dispense:  2 Device    Refill:  1    Give patient one two pak    Patient Instructions  Environmental control of dust mite Zyrtec 10 mg-take 1 tablet once a day  for runny nose or itchy eyes Fluticasone 2 sprays per nostril once a day if needed for stuffy nose Montelukast 10 mg-take 1 tablet once a day to prevent coughing or wheezing Flovent 110-1 puff twice a day to prevent coughing or wheezing Pro-air 2 puffs every 4 hours if needed for wheezing or coughing spells.  You may use Pro-air 2 puffs 5 to 15 minutes before exercise Add prednisone 10 mg twice a day for 4 days, 10 mg in the fifth day to bring the asthma under control Continue on your other medications Call us if you are not doing well on this treatment plan  Avoid shellfish.  If you have an allergic reaction from shellfish or an insect sting, take Benadryl 50 mg  every 4 hours and if you have life-threatening symptoms inject with EpiPen 0.3 mg   Return in about 4 weeks (around 12/31/2018).   Thank you for the opportunity to care for this patient.  Please do not hesitate to contact me with questions.  Tonette Bihari, M.D.  [facility] [address] [place], [place] [address] [phone]

## 2019-01-01 ENCOUNTER — Ambulatory Visit: Payer: Medicare Other | Admitting: Pediatrics

## 2019-01-01 DIAGNOSIS — J309 Allergic rhinitis, unspecified: Secondary | ICD-10-CM

## 2019-01-04 ENCOUNTER — Encounter (HOSPITAL_BASED_OUTPATIENT_CLINIC_OR_DEPARTMENT_OTHER): Payer: Self-pay | Admitting: *Deleted

## 2019-01-04 ENCOUNTER — Emergency Department (HOSPITAL_BASED_OUTPATIENT_CLINIC_OR_DEPARTMENT_OTHER): Payer: Medicare Other

## 2019-01-04 ENCOUNTER — Other Ambulatory Visit: Payer: Self-pay

## 2019-01-04 ENCOUNTER — Emergency Department (HOSPITAL_BASED_OUTPATIENT_CLINIC_OR_DEPARTMENT_OTHER)
Admission: EM | Admit: 2019-01-04 | Discharge: 2019-01-04 | Disposition: A | Discharge status: Home / Self Care | Payer: Medicare Other | Attending: Emergency Medicine | Admitting: Emergency Medicine

## 2019-01-04 DIAGNOSIS — E119 Type 2 diabetes mellitus without complications: Secondary | ICD-10-CM | POA: Insufficient documentation

## 2019-01-04 DIAGNOSIS — R0602 Shortness of breath: Secondary | ICD-10-CM | POA: Diagnosis present

## 2019-01-04 DIAGNOSIS — Z87891 Personal history of nicotine dependence: Secondary | ICD-10-CM | POA: Insufficient documentation

## 2019-01-04 DIAGNOSIS — I10 Essential (primary) hypertension: Secondary | ICD-10-CM | POA: Insufficient documentation

## 2019-01-04 DIAGNOSIS — Z7982 Long term (current) use of aspirin: Secondary | ICD-10-CM | POA: Diagnosis not present

## 2019-01-04 DIAGNOSIS — J9801 Acute bronchospasm: Secondary | ICD-10-CM | POA: Diagnosis not present

## 2019-01-04 DIAGNOSIS — Z79899 Other long term (current) drug therapy: Secondary | ICD-10-CM | POA: Insufficient documentation

## 2019-01-04 DIAGNOSIS — N3000 Acute cystitis without hematuria: Secondary | ICD-10-CM | POA: Insufficient documentation

## 2019-01-04 DIAGNOSIS — Z7984 Long term (current) use of oral hypoglycemic drugs: Secondary | ICD-10-CM | POA: Diagnosis not present

## 2019-01-04 LAB — URINALYSIS, ROUTINE W REFLEX MICROSCOPIC
Bilirubin Urine: NEGATIVE
Glucose, UA: NEGATIVE mg/dL
Hgb urine dipstick: NEGATIVE
Ketones, ur: 15 mg/dL — AB
Nitrite: NEGATIVE
Protein, ur: NEGATIVE mg/dL
Specific Gravity, Urine: 1.025 (ref 1.005–1.030)
pH: 6 (ref 5.0–8.0)

## 2019-01-04 LAB — COMPREHENSIVE METABOLIC PANEL
ALK PHOS: 84 U/L (ref 38–126)
ALT: 15 U/L (ref 0–44)
ANION GAP: 5 (ref 5–15)
AST: 23 U/L (ref 15–41)
Albumin: 3.9 g/dL (ref 3.5–5.0)
BILIRUBIN TOTAL: 0.7 mg/dL (ref 0.3–1.2)
BUN: 22 mg/dL (ref 8–23)
CALCIUM: 9.1 mg/dL (ref 8.9–10.3)
CO2: 28 mmol/L (ref 22–32)
Chloride: 106 mmol/L (ref 98–111)
Creatinine, Ser: 0.82 mg/dL (ref 0.44–1.00)
GLUCOSE: 83 mg/dL (ref 70–99)
POTASSIUM: 3.8 mmol/L (ref 3.5–5.1)
Sodium: 139 mmol/L (ref 135–145)
TOTAL PROTEIN: 7.4 g/dL (ref 6.5–8.1)

## 2019-01-04 LAB — URINALYSIS, MICROSCOPIC (REFLEX)

## 2019-01-04 LAB — CBC
HEMATOCRIT: 40.5 % (ref 36.0–46.0)
HEMOGLOBIN: 12.4 g/dL (ref 12.0–15.0)
MCH: 26.1 pg (ref 26.0–34.0)
MCHC: 30.6 g/dL (ref 30.0–36.0)
MCV: 85.1 fL (ref 80.0–100.0)
Platelets: 317 10*3/uL (ref 150–400)
RBC: 4.76 MIL/uL (ref 3.87–5.11)
RDW: 15.9 % — ABNORMAL HIGH (ref 11.5–15.5)
WBC: 6.6 10*3/uL (ref 4.0–10.5)
nRBC: 0 % (ref 0.0–0.2)

## 2019-01-04 LAB — TROPONIN I: Troponin I: <0.03 ng/mL

## 2019-01-04 MED ORDER — CEPHALEXIN 500 MG PO CAPS: 500.0000 mg | ORAL_CAPSULE | Freq: Three times a day (TID) | ORAL | 0 refills | Status: DC

## 2019-01-04 MED ORDER — ONDANSETRON HCL 4 MG/2ML IJ SOLN
4.0000 mg | Freq: Once | INTRAMUSCULAR | Status: AC
  Administered 2019-01-04: 4 mg via INTRAVENOUS
  Filled 2019-01-04: qty 2

## 2019-01-04 NOTE — ED Notes (Signed)
ED Provider at bedside. 

## 2019-01-04 NOTE — ED Provider Notes (Addendum)
[facility] [place] EMERGENCY DEPARTMENT Provider Note   CSN: [id number] Arrival date & time: 01/04/19  1315    History   Chief Complaint Chief Complaint  Patient presents with  . Shortness of Breath    HPI Donna Frank is a 73 y.o. female.     HPI Patient is a 73 year old female presents the emergency department with increased exertional shortness of breath over the past several days.  She is tried her albuterol inhaler today with some improvement.  She has had some urinary symptoms as well over the past 2 weeks with some radiating right groin pain towards her right flank.  No history of kidney stones.  She feels like she is having bladder spasms.  Denies new lower extremity swelling.  No orthopnea.  No history of DVT or pulmonary embolism.  Denies a history of congestive heart failure.  No chest pain or chest tightness.  Past Medical History:  Diagnosis Date  . Asthma   . Diabetes mellitus without complication (HCC)   . Hiatal hernia   . Hyperlipidemia   . Hypertension   . Pinched nerve in neck    neck and shoulder  . Pre-diabetes   . Reflux   . Vertigo     Patient Active Problem List   Diagnosis Date Noted  . Mild persistent asthma without complication 12/03/2018  . Other allergic rhinitis 12/03/2018  . Anaphylactic shock due to adverse food reaction 12/03/2018  . Gastroesophageal reflux disease without esophagitis 12/03/2018  . Essential hypertension 12/03/2018  . Prediabetes 12/03/2018  . History of glaucoma 12/03/2018  . Allergic reaction to insect sting 12/03/2018  . Diabetic neuropathy, type II diabetes mellitus (HCC) 01/27/2015  . Extrinsic asthma 09/11/2014  . Hammer toe, acquired 11/27/2013  . Unspecified hereditary and idiopathic peripheral neuropathy 07/23/2013  . Bunion of great toe 07/23/2013    Past Surgical History:  Procedure Laterality Date  . ABDOMINAL HYSTERECTOMY     partial  . CERVICAL DISC SURGERY  2017  . KNEE SURGERY  2019   replacement     OB History   No obstetric history on file.      Home Medications    Prior to Admission medications   Medication Sig Start Date End Date Taking? Authorizing Provider  acetaminophen (TYLENOL) 325 MG tablet Take by mouth. 03/10/18   [provider]  albuterol (PROAIR HFA) 108 (90 Base) MCG/ACT inhaler Inhale into the lungs every 6 (six) hours as needed for wheezing or shortness of breath.    [provider]  albuterol (PROVENTIL) (2.5 MG/3ML) 0.083% nebulizer solution Take 2.5 mg by nebulization every 6 (six) hours as needed for wheezing or shortness of breath.    [provider]  Alcaftadine (LASTACAFT) 0.25 % SOLN Apply to eye.    [provider]  aspirin 81 MG chewable tablet Chew 4 tablets (324 mg total) by mouth daily. 12/20/14   Gerhard Munch, MD  cephALEXin (KEFLEX) 500 MG capsule Take 1 capsule (500 mg total) by mouth 3 (three) times daily. 01/04/19   Azalia Bilis, MD  cholecalciferol (VITAMIN D) 1000 UNITS tablet Take 1,000 Units by mouth daily.    [provider]  clobetasol (TEMOVATE) 0.05 % external solution 1(ONE) APPLICATION(S) SCALP 2(TWO) TIMES A DAY 11/14/18   [provider]  diazepam (VALIUM) 2 MG tablet TAKE 1 TABLET BY MOUTH 2 TIMES DAILY AS NEEDED FOR ANXIETY. 06/16/17   [provider]  DULoxetine (CYMBALTA) 60 MG capsule Take 60 mg by mouth daily.  [provider]  EPINEPHrine (EPIPEN 2-PAK) 0.3 mg/0.3 mL IJ SOAJ injection Use for severe allergic reactions 12/03/18   Fletcher Anon, MD  Esomeprazole Magnesium (NEXIUM PO) Take by mouth.    [provider]  fluticasone (FLOVENT HFA) 110 MCG/ACT inhaler Inhale 1 puff into the lungs 2 (two) times daily. To prevent coughing or wheezing. 12/03/18   Fletcher Anon, MD  lisinopril (PRINIVIL,ZESTRIL) 10 MG tablet Take 40 mg by mouth daily.     [provider]  meclizine (ANTIVERT) 25 MG tablet Take 25 mg by mouth 3 (three)  times daily as needed.    [provider]  metFORMIN (GLUCOPHAGE) 500 MG tablet Take 500 mg by mouth. Half tab with meal at evening    [provider]  montelukast (SINGULAIR) 10 MG tablet Take 1 tablet (10 mg total) by mouth at bedtime. To prevent coughing or wheezing. 12/03/18   Fletcher Anon, MD    Family History Family History  Problem Relation Age of Onset  . Emphysema Maternal Grandfather   . COPD Mother   . Heart disease Maternal Uncle   . Heart disease Maternal Aunt   . Rheum arthritis Sister   . Asthma Granddaughter   . Allergic rhinitis Granddaughter   . Asthma Grandson   . Allergic rhinitis Grandson   . Lupus Niece   . Immunodeficiency Neg Hx   . Angioedema Neg Hx   . Atopy Neg Hx   . Urticaria Neg Hx     Social History Social History   Tobacco Use  . Smoking status: Former Smoker    Packs/day: 0.50    Years: 35.00    Pack years: 17.50    Last attempt to quit: 12/21/2008    Years since quitting: 10.0  . Smokeless tobacco: Never Used  Substance Use Topics  . Alcohol use: No    Alcohol/week: 0.0 standard drinks  . Drug use: No     Allergies   Naproxen sodium; Shellfish allergy; Codeine; Iodine; Penicillins; Prednisone; Sulfa antibiotics; Vicodin [hydrocodone-acetaminophen]; and Levofloxacin   Review of Systems Review of Systems  All other systems reviewed and are negative.    Physical Exam Updated Vital Signs BP 131/77   Pulse 81   Temp 98.7 F (37.1 C) (Oral)   Resp (!) 22   Ht 5\' 2"  (1.575 m)   Wt 83.5 kg   SpO2 96%   BMI 33.65 kg/m   Physical Exam Vitals signs and nursing note reviewed.  Constitutional:      General: She is not in acute distress.    Appearance: She is well-developed.  HENT:     Head: Normocephalic and atraumatic.  Neck:     Musculoskeletal: Normal range of motion.  Cardiovascular:     Rate and Rhythm: Normal rate and regular rhythm.     Heart sounds: Normal heart sounds.  Pulmonary:      Effort: Pulmonary effort is normal.     Breath sounds: Normal breath sounds.  Abdominal:     General: There is no distension.     Palpations: Abdomen is soft.     Tenderness: There is no abdominal tenderness.  Musculoskeletal: Normal range of motion.     Right lower leg: No edema.     Left lower leg: No edema.  Skin:    General: Skin is warm and dry.  Neurological:     Mental Status: She is alert and oriented to person, place, and time.  Psychiatric:  Judgment: Judgment normal.      ED Treatments / Results  Labs (all labs ordered are listed, but only abnormal results are displayed) Labs Reviewed  CBC - Abnormal; Notable for the following components:      Result Value   RDW 15.9 (*)    All other components within normal limits  URINALYSIS, ROUTINE W REFLEX MICROSCOPIC - Abnormal; Notable for the following components:   Ketones, ur 15 (*)    Leukocytes,Ua SMALL (*)    All other components within normal limits  URINALYSIS, MICROSCOPIC (REFLEX) - Abnormal; Notable for the following components:   Bacteria, UA FEW (*)    All other components within normal limits  COMPREHENSIVE METABOLIC PANEL  TROPONIN I    EKG EKG Interpretation  Date/Time:  Friday January 04 2019 13:24:07 EST Ventricular Rate:  78 PR Interval:    QRS Duration: 85 QT Interval:  365 QTC Calculation: 416 R Axis:   -19 Text Interpretation:  Sinus rhythm Inferior infarct, old Baseline wander in lead(s) I II aVR aVF V2 No significant change was found Confirmed by Azalia Bilisampos, Sherman Donaldson ([id number]) on 01/04/2019 2:09:31 PM   Radiology Dg Chest 2 View  Result Date: 01/04/2019 CLINICAL DATA:  Shortness of breath on exertion. Symptoms for 3 days. History of asthma. EXAM: CHEST - 2 VIEW COMPARISON:  06/05/2018 FINDINGS: Cardiac silhouette is normal in size. No mediastinal or hilar masses. Evidence of a small sliding hiatal hernia. No evidence of adenopathy. Clear lungs.  No pleural effusion or pneumothorax. Skeletal  structures are intact. IMPRESSION: No active cardiopulmonary disease. Electronically Signed   By: Amie Portlandavid  Ormond M.D.   On: 01/04/2019 13:48   Ct Renal Stone Study  Result Date: 01/04/2019 CLINICAL DATA:  Pelvic and bladder region pain and pressure. Being treated for a urinary tract infection for the past 2 weeks. Previous hysterectomy. EXAM: CT ABDOMEN AND PELVIS WITHOUT CONTRAST TECHNIQUE: Multidetector CT imaging of the abdomen and pelvis was performed following the standard protocol without IV contrast. COMPARISON:  01/28/2017. FINDINGS: Lower chest: Unremarkable. Previously demonstrated small nodular densities at the right lung base are no longer visualized. Hepatobiliary: Stable small calcified granuloma in the periphery of the liver on the right. Stable 7 mm posterior right lobe liver probable cyst or hemangioma on image number 9 series 2. Unremarkable gallbladder. Pancreas: Unremarkable. No pancreatic ductal dilatation or surrounding inflammatory changes. Spleen: Normal in size without focal abnormality. Adrenals/Urinary Tract: Adrenal glands are unremarkable. Kidneys are normal, without renal calculi, focal lesion, or hydronephrosis. Bladder is unremarkable. Stomach/Bowel: Small hiatal hernia. Large number of sigmoid and descending colon diverticula. Unremarkable small bowel. Small, normal appearing appendix. Vascular/Lymphatic: Atheromatous arterial calcifications without aneurysm. No enlarged lymph nodes. Reproductive: Status post hysterectomy. No adnexal masses. Other: No abdominal wall hernia or abnormality. No abdominopelvic ascites. Musculoskeletal: Lumbar lower thoracic spine degenerative changes, including facet degenerative changes with associated grade 1 anterolisthesis at the L4-5 level. There is also patchy sclerosis and lucency throughout the lumbar thoracolumbar and lumbosacral spine, not evident in the remainder the bones. This has not changed significantly. IMPRESSION: 1. No acute  abnormality. 2. Extensive sigmoid and descending colon diverticulosis. 3. Stable mild diffuse patchy sclerosis and lucency throughout the thoracolumbar and lumbosacral spine. This is most likely metabolic in nature. In the absence of known malignancy and in the absence of interval change, it is unlikely that this represents a diffuse metastatic process such as metastatic breast cancer. Electronically Signed   By: Beckie SaltsSteven  Reid M.D.   On: 01/04/2019 14:12  Procedures Procedures (including critical care time)  Medications Ordered in ED Medications  ondansetron (ZOFRAN) injection 4 mg (4 mg Intravenous Given 01/04/19 1407)     Initial Impression / Assessment and Plan / ED Course  I have reviewed the triage vital signs and the nursing notes.  Pertinent labs & imaging results that were available during my care of the patient were reviewed by me and considered in my medical decision making (see chart for details).       Patient is overall well-appearing.  Her vital signs are stable.  I personally walked the patient around the emergency department.  After walking an entire lap around the emergency department her heart rate is 87.  Her pulse ox is 97%.  Continues to speak in full sentences.  Doubt DVT/pulmonary embolism.  Doubt ACS.  Suspect mild asthma exacerbation.  She has not significantly tried her bronchodilators at home.  And recommending that she try bronchodilators over the next several days.  CT demonstrates no evidence of ureteral stone.  Patient be covered for urinary tract infection.  Close primary care follow-up.  Patient understands return the emergency department for new or worsening symptoms.  At this time I do not think the patient needs any additional work-up.   Final Clinical Impressions(s) / ED Diagnoses   Final diagnoses:  Bronchospasm  Acute cystitis without hematuria    ED Discharge Orders         Ordered    cephALEXin (KEFLEX) 500 MG capsule  3 times daily      01/04/19 1511           Azalia Bilis, MD 01/04/19 1514    Azalia Bilis, MD 01/04/19 [phone]

## 2019-01-04 NOTE — ED Triage Notes (Addendum)
Pt reports increased SOB with activity since Tuesday. Pt reports bladder and spasms x 2 weeks. Pt alert, states she has been trying to treat UTI Sx at home until she started getting SOB when she was changing her bed. Has been using her inhalers with some relief. States she is supposed to see her neurologist next week for left side facial numbness

## 2019-01-04 NOTE — Discharge Instructions (Addendum)
Use your albuterol inhaler every 4 hours for the next 2 days  Please call your primary care physician for close follow-up  Return the emergency department for any new or worsening symptoms

## 2019-03-04 ENCOUNTER — Other Ambulatory Visit: Payer: Self-pay | Admitting: Pediatrics

## 2019-05-13 ENCOUNTER — Ambulatory Visit (INDEPENDENT_AMBULATORY_CARE_PROVIDER_SITE_OTHER): Payer: Medicare Other | Admitting: Pediatrics

## 2019-05-13 ENCOUNTER — Encounter: Payer: Self-pay | Admitting: Pediatrics

## 2019-05-13 ENCOUNTER — Other Ambulatory Visit: Payer: Self-pay

## 2019-05-13 VITALS — BP 138/80 | HR 89 | Temp 97.7°F | Resp 16

## 2019-05-13 DIAGNOSIS — T7800XD Anaphylactic reaction due to unspecified food, subsequent encounter: Secondary | ICD-10-CM

## 2019-05-13 DIAGNOSIS — I10 Essential (primary) hypertension: Secondary | ICD-10-CM

## 2019-05-13 DIAGNOSIS — R7303 Prediabetes: Secondary | ICD-10-CM

## 2019-05-13 DIAGNOSIS — J4540 Moderate persistent asthma, uncomplicated: Secondary | ICD-10-CM

## 2019-05-13 DIAGNOSIS — J3089 Other allergic rhinitis: Secondary | ICD-10-CM

## 2019-05-13 MED ORDER — ALBUTEROL SULFATE HFA 108 (90 BASE) MCG/ACT IN AERS: 2.0000 | INHALATION_SPRAY | Freq: Four times a day (QID) | RESPIRATORY_TRACT | 1 refills | Status: AC | PRN

## 2019-05-13 NOTE — Patient Instructions (Addendum)
Zyrtec 10 mg-take 1 tablet once a day for runny nose or itchy eyes Fluticasone 2 sprays per nostril once a day if needed for stuffy nose  Montelukast 10 mg-take 1 tablet once a day to prevent coughing or wheezing Flovent 110-1 puff twice a day to prevent coughing or wheezing Pro-air 2 puffs every 4 hours if needed for wheezing or coughing spells.  You may use Pro-air 2 puffs 5 to 15 minutes before exercise  Continue avoiding shellfish.  If you have an allergic reaction from shellfish or an insect sting, take Benadryl 50 mg every 4 hours and if you have life-threatening symptoms inject with EpiPen 0.3 mg  Continue on  your other medications Call us if you are not doing well on this treatment plan

## 2019-05-13 NOTE — Progress Notes (Signed)
[address] [place] [id number] Dept: [id number]  FOLLOW UP NOTE  Patient ID: Donna Frank, female    DOB: 08-16-1946  Age: 73 y.o. MRN: [id number] Date of Office Visit: 05/13/2019  Assessment  Chief Complaint: Asthma  HPI Donna DyBetty Bia presents for evaluation of sinus pressure.  She is not using Zyrtec and fluticasone nasal spray on a daily basis.  Her asthma is well controlled with montelukast 10 mg once a day and Flovent 110-1 puff twice a day.  She continues to avoid shellfish.   Drug Allergies:  Allergies  Allergen Reactions  . Naproxen Sodium Nausea And Vomiting and Palpitations    Acid reflux  . Shellfish Allergy Itching and Swelling  . Codeine   . Iodine Itching  . Penicillins Nausea Only  . Prednisone Nausea And Vomiting    Only allergic to pink pills. Can take injection ok.   . Sulfa Antibiotics   . Vicodin [Hydrocodone-Acetaminophen] Nausea And Vomiting    "I don't want none of that"   . Levofloxacin Itching and Other (See Comments)    Insomnia Insomnia, itching, myalgia     Physical Exam: BP 138/80   Pulse 89   Temp 97.7 F (36.5 C) (Oral)   Resp 16   SpO2 98%    Physical Exam Vitals signs reviewed.  Constitutional:      Appearance: Normal appearance. She is obese.  HENT:     Head:     Comments: Eyes normal.  Ears normal.  Nose mild swelling of nasal turbinates.  Pharynx normal. Neck:     Musculoskeletal: Neck supple.  Cardiovascular:     Comments: S1-S2 normal no murmurs Pulmonary:     Comments: Clear to percussion and auscultation Lymphadenopathy:     Cervical: No cervical adenopathy.  Neurological:     General: No focal deficit present.     Mental Status: She is alert and oriented to person, place, and time.  Psychiatric:        Mood and Affect: Mood normal.        Behavior: Behavior normal.        Thought Content: Thought content normal.        Judgment: Judgment normal.     Diagnostics: FVC 1.60 L FEV1 1.13 L.   Predicted FVC 2.26 L predicted FEV1 1.75 L-this shows a mild reduction in the forced vital capacity and FEV1 but very stable for her   Assessment and Plan: 1. Moderate persistent asthma without complication   2. Anaphylactic shock due to food, subsequent encounter   3. Essential hypertension   4. Prediabetes   5. Other allergic rhinitis     Meds ordered this encounter  Medications  . albuterol (PROAIR HFA) 108 (90 Base) MCG/ACT inhaler    Sig: Inhale 2 puffs into the lungs every 6 (six) hours as needed for wheezing or shortness of breath.    Dispense:  8 g    Refill:  1    Patient Instructions  Zyrtec 10 mg-take 1 tablet once a day for runny nose or itchy eyes Fluticasone 2 sprays per nostril once a day if needed for stuffy nose  Montelukast 10 mg-take 1 tablet once a day to prevent coughing or wheezing Flovent 110-1 puff twice a day to prevent coughing or wheezing Pro-air 2 puffs every 4 hours if needed for wheezing or coughing spells.  You may use Pro-air 2 puffs 5 to 15 minutes before exercise  Continue avoiding shellfish.  If you have an allergic  reaction from shellfish or an insect sting, take Benadryl 50 mg every 4 hours and if you have life-threatening symptoms inject with EpiPen 0.3 mg  Continue on  your other medications Call us if you are not doing well on this treatment plan    Return in about 6 months (around 11/13/2019).    Thank you for the opportunity to care for this patient.  Please do not hesitate to contact me with questions.  Penne Lash, M.D.  [facility] [address] [place], [place] [address] [phone]

## 2019-05-29 ENCOUNTER — Other Ambulatory Visit: Payer: Self-pay | Admitting: Pediatrics

## 2019-10-22 ENCOUNTER — Other Ambulatory Visit: Payer: Self-pay | Admitting: Pediatrics

## 2020-01-09 ENCOUNTER — Other Ambulatory Visit: Payer: Self-pay

## 2020-01-09 ENCOUNTER — Emergency Department (HOSPITAL_BASED_OUTPATIENT_CLINIC_OR_DEPARTMENT_OTHER): Payer: Medicare Other

## 2020-01-09 ENCOUNTER — Emergency Department (HOSPITAL_BASED_OUTPATIENT_CLINIC_OR_DEPARTMENT_OTHER)
Admission: EM | Admit: 2020-01-09 | Discharge: 2020-01-09 | Disposition: A | Discharge status: Home / Self Care | Payer: Medicare Other | Attending: Emergency Medicine | Admitting: Emergency Medicine

## 2020-01-09 ENCOUNTER — Encounter (HOSPITAL_BASED_OUTPATIENT_CLINIC_OR_DEPARTMENT_OTHER): Payer: Self-pay

## 2020-01-09 DIAGNOSIS — E119 Type 2 diabetes mellitus without complications: Secondary | ICD-10-CM | POA: Insufficient documentation

## 2020-01-09 DIAGNOSIS — E785 Hyperlipidemia, unspecified: Secondary | ICD-10-CM | POA: Diagnosis not present

## 2020-01-09 DIAGNOSIS — R519 Headache, unspecified: Secondary | ICD-10-CM

## 2020-01-09 DIAGNOSIS — M542 Cervicalgia: Secondary | ICD-10-CM | POA: Diagnosis not present

## 2020-01-09 DIAGNOSIS — I10 Essential (primary) hypertension: Secondary | ICD-10-CM | POA: Insufficient documentation

## 2020-01-09 DIAGNOSIS — J45909 Unspecified asthma, uncomplicated: Secondary | ICD-10-CM | POA: Diagnosis not present

## 2020-01-09 DIAGNOSIS — Z888 Allergy status to other drugs, medicaments and biological substances status: Secondary | ICD-10-CM | POA: Diagnosis not present

## 2020-01-09 DIAGNOSIS — Z79899 Other long term (current) drug therapy: Secondary | ICD-10-CM | POA: Diagnosis not present

## 2020-01-09 DIAGNOSIS — Z8673 Personal history of transient ischemic attack (TIA), and cerebral infarction without residual deficits: Secondary | ICD-10-CM | POA: Insufficient documentation

## 2020-01-09 DIAGNOSIS — Z882 Allergy status to sulfonamides status: Secondary | ICD-10-CM | POA: Diagnosis not present

## 2020-01-09 DIAGNOSIS — Z87891 Personal history of nicotine dependence: Secondary | ICD-10-CM | POA: Insufficient documentation

## 2020-01-09 DIAGNOSIS — Z881 Allergy status to other antibiotic agents status: Secondary | ICD-10-CM | POA: Insufficient documentation

## 2020-01-09 HISTORY — DX: Cerebral infarction, unspecified: I63.9

## 2020-01-09 LAB — CBC WITH DIFFERENTIAL/PLATELET
Abs Immature Granulocytes: 0.01 10*3/uL (ref 0.00–0.07)
Basophils Absolute: 0 10*3/uL (ref 0.0–0.1)
Basophils Relative: 0 %
Eosinophils Absolute: 0.2 10*3/uL (ref 0.0–0.5)
Eosinophils Relative: 2 %
HCT: 40.6 % (ref 36.0–46.0)
Hemoglobin: 12.8 g/dL (ref 12.0–15.0)
Immature Granulocytes: 0 %
Lymphocytes Relative: 52 %
Lymphs Abs: 4.4 10*3/uL — ABNORMAL HIGH (ref 0.7–4.0)
MCH: 26.8 pg (ref 26.0–34.0)
MCHC: 31.5 g/dL (ref 30.0–36.0)
MCV: 84.9 fL (ref 80.0–100.0)
Monocytes Absolute: 0.5 10*3/uL (ref 0.1–1.0)
Monocytes Relative: 6 %
Neutro Abs: 3.4 10*3/uL (ref 1.7–7.7)
Neutrophils Relative %: 40 %
Platelets: 376 10*3/uL (ref 150–400)
RBC: 4.78 MIL/uL (ref 3.87–5.11)
RDW: 17 % — ABNORMAL HIGH (ref 11.5–15.5)
WBC: 8.5 10*3/uL (ref 4.0–10.5)
nRBC: 0 % (ref 0.0–0.2)

## 2020-01-09 LAB — COMPREHENSIVE METABOLIC PANEL
ALT: 23 U/L (ref 0–44)
AST: 18 U/L (ref 15–41)
Albumin: 3.7 g/dL (ref 3.5–5.0)
Alkaline Phosphatase: 81 U/L (ref 38–126)
Anion gap: 8 (ref 5–15)
BUN: 29 mg/dL — ABNORMAL HIGH (ref 8–23)
CO2: 27 mmol/L (ref 22–32)
Calcium: 8.9 mg/dL (ref 8.9–10.3)
Chloride: 104 mmol/L (ref 98–111)
Creatinine, Ser: 0.91 mg/dL (ref 0.44–1.00)
GFR calc Af Amer: >60 mL/min (ref >60)
GFR calc non Af Amer: >60 mL/min (ref >60)
Glucose, Bld: 78 mg/dL (ref 70–99)
Potassium: 3.9 mmol/L (ref 3.5–5.1)
Sodium: 139 mmol/L (ref 135–145)
Total Bilirubin: 0.7 mg/dL (ref 0.3–1.2)
Total Protein: 6.9 g/dL (ref 6.5–8.1)

## 2020-01-09 MED ORDER — DIPHENHYDRAMINE HCL 50 MG/ML IJ SOLN
25.0000 mg | Freq: Once | INTRAMUSCULAR | Status: AC
  Administered 2020-01-09: 25 mg via INTRAVENOUS
  Filled 2020-01-09: qty 1

## 2020-01-09 MED ORDER — SODIUM CHLORIDE 0.9 % IV BOLUS
500.0000 mL | Freq: Once | INTRAVENOUS | Status: AC
  Administered 2020-01-09: 500 mL via INTRAVENOUS

## 2020-01-09 MED ORDER — PROCHLORPERAZINE EDISYLATE 10 MG/2ML IJ SOLN
10.0000 mg | Freq: Once | INTRAMUSCULAR | Status: AC
  Administered 2020-01-09: 10 mg via INTRAVENOUS
  Filled 2020-01-09: qty 2

## 2020-01-09 NOTE — ED Triage Notes (Addendum)
Pt c/o pain to left side of head and neck ~12pm-states she had recent admn with a "pinched nerve"-states this pain is different and feels same as when she had a stroke Nov 2020-denies changes in speech/swallowing-states left sie UE weakness is residual from previous stroke and feels same-NAD-to triage in w/c

## 2020-01-09 NOTE — Discharge Instructions (Addendum)
Your laboratory results are within normal limits today.  The CT scan of your head was normal.  Please follow-up with your primary care physician as needed.

## 2020-01-09 NOTE — ED Provider Notes (Signed)
[facility] [place] EMERGENCY DEPARTMENT Provider Note   CSN: [id number] Arrival date & time: 01/09/20  1644     History Chief Complaint  Patient presents with  . Headache    Donna Frank is a 74 y.o. female.  74 y.o female with a PMH of DM, HTN, Pinched nerve, Stroke with left sided weakness presents to the ED with a chief complaint of headache x noon. She reports having injection placed to her neck on Tuesday and having to be seen in the ED on Thursday for chest pain work-up.  She reports this happened at [facility], states the headache was of sudden onset today, describes it as a sharp pain to the occipital region with radiation to the front of her head, this is similar to her previous headaches.  She does currently get steroid injections to her neck for a pinched nerve.  No nausea, vomiting, blurry vision, weakness.  The history is provided by the patient and medical records.  Headache Pain location:  Occipital Quality:  Sharp Radiates to:  Face Severity at highest:  6/10 Onset quality:  Sudden Duration:  5 hours Timing:  Constant Progression:  Unchanged Chronicity:  Recurrent Context: not exposure to bright light and not loud noise   Worsened by:  Neck movement Ineffective treatments:  Cold packs and acetaminophen Associated symptoms: myalgias, neck pain and tingling (left hand)   Associated symptoms: no blurred vision, no dizziness, no fever, no nausea, no neck stiffness, no numbness, no paresthesias, no photophobia, no sinus pressure, no sore throat and no visual change        Past Medical History:  Diagnosis Date  . Asthma   . Diabetes mellitus without complication (HCC)   . Hiatal hernia   . Hyperlipidemia   . Hypertension   . Pinched nerve in neck    neck and shoulder  . Pre-diabetes   . Reflux   . Stroke (HCC)   . Vertigo     Patient Active Problem List   Diagnosis Date Noted  . Mild persistent asthma without complication 12/03/2018  .  Other allergic rhinitis 12/03/2018  . Anaphylactic shock due to adverse food reaction 12/03/2018  . Gastroesophageal reflux disease without esophagitis 12/03/2018  . Essential hypertension 12/03/2018  . Prediabetes 12/03/2018  . History of glaucoma 12/03/2018  . Allergic reaction to insect sting 12/03/2018  . Diabetic neuropathy, type II diabetes mellitus (HCC) 01/27/2015  . Extrinsic asthma 09/11/2014  . Hammer toe, acquired 11/27/2013  . Unspecified hereditary and idiopathic peripheral neuropathy 07/23/2013  . Bunion of great toe 07/23/2013    Past Surgical History:  Procedure Laterality Date  . ABDOMINAL HYSTERECTOMY     partial  . CERVICAL DISC SURGERY  2017  . KNEE SURGERY  2019   replacement     OB History   No obstetric history on file.     Family History  Problem Relation Age of Onset  . Emphysema Maternal Grandfather   . COPD Mother   . Heart disease Maternal Uncle   . Heart disease Maternal Aunt   . Rheum arthritis Sister   . Asthma Granddaughter   . Allergic rhinitis Granddaughter   . Asthma Grandson   . Allergic rhinitis Grandson   . Lupus Niece   . Immunodeficiency Neg Hx   . Angioedema Neg Hx   . Atopy Neg Hx   . Urticaria Neg Hx     Social History   Tobacco Use  . Smoking status: Former Smoker  Packs/day: 0.50    Years: 35.00    Pack years: 17.50    Quit date: 12/21/2008    Years since quitting: 11.0  . Smokeless tobacco: Never Used  Substance Use Topics  . Alcohol use: No    Alcohol/week: 0.0 standard drinks  . Drug use: No    Home Medications Prior to Admission medications   Medication Sig Start Date End Date Taking? Authorizing Provider  acetaminophen (TYLENOL) 325 MG tablet Take by mouth. 03/10/18   [provider]  albuterol (PROAIR HFA) 108 (90 Base) MCG/ACT inhaler Inhale 2 puffs into the lungs every 6 (six) hours as needed for wheezing or shortness of breath. 05/13/19   Fletcher Anon, MD  albuterol (PROVENTIL) (2.5  MG/3ML) 0.083% nebulizer solution Take 2.5 mg by nebulization every 6 (six) hours as needed for wheezing or shortness of breath.    [provider]  Alcaftadine (LASTACAFT) 0.25 % SOLN Apply to eye.    [provider]  aspirin 81 MG chewable tablet Chew 4 tablets (324 mg total) by mouth daily. 12/20/14   Gerhard Munch, MD  cephALEXin (KEFLEX) 500 MG capsule Take 1 capsule (500 mg total) by mouth 3 (three) times daily. 01/04/19   Azalia Bilis, MD  cholecalciferol (VITAMIN D) 1000 UNITS tablet Take 1,000 Units by mouth daily.    [provider]  clobetasol (TEMOVATE) 0.05 % external solution 1(ONE) APPLICATION(S) SCALP 2(TWO) TIMES A DAY 11/14/18   [provider]  diazepam (VALIUM) 2 MG tablet TAKE 1 TABLET BY MOUTH 2 TIMES DAILY AS NEEDED FOR ANXIETY. 06/16/17   [provider]  DULoxetine (CYMBALTA) 60 MG capsule Take 60 mg by mouth daily.    [provider]  EPINEPHrine (EPIPEN 2-PAK) 0.3 mg/0.3 mL IJ SOAJ injection Use for severe allergic reactions 12/03/18   Fletcher Anon, MD  Esomeprazole Magnesium (NEXIUM PO) Take by mouth.    [provider]  FLOVENT HFA 110 MCG/ACT inhaler INHALE 1 PUFF INTO THE LUNGS 2 (TWO) TIMES DAILY. TO PREVENT COUGHING OR WHEEZING. 10/22/19   Fletcher Anon, MD  lisinopril (PRINIVIL,ZESTRIL) 10 MG tablet Take 40 mg by mouth daily.     [provider]  metFORMIN (GLUCOPHAGE) 500 MG tablet Take 500 mg by mouth. Half tab with meal at evening    [provider]  montelukast (SINGULAIR) 10 MG tablet TAKE 1 TABLET (10 MG TOTAL) BY MOUTH AT BEDTIME. TO PREVENT COUGHING OR WHEEZING. 05/29/19   Fletcher Anon, MD  pravastatin (PRAVACHOL) 40 MG tablet Take 40 mg by mouth daily.    [provider]    Allergies    Naproxen sodium, Shellfish allergy, Codeine, Iodine, Prednisone, Sulfa antibiotics, and Levofloxacin  Review of Systems   Review of Systems  Constitutional: Negative for  fever.  HENT: Negative for sinus pressure and sore throat.   Eyes: Negative for blurred vision and photophobia.  Respiratory: Negative for shortness of breath.   Cardiovascular: Negative for chest pain.  Gastrointestinal: Negative for nausea.  Genitourinary: Negative for flank pain.  Musculoskeletal: Positive for myalgias and neck pain. Negative for neck stiffness.  Skin: Negative for pallor and wound.  Neurological: Positive for headaches. Negative for dizziness, numbness and paresthesias.  All other systems reviewed and are negative.   Physical Exam Updated Vital Signs BP (!) 112/56   Pulse 69   Temp 98.1 F (36.7 C) (Oral)   Resp 18   Ht 5\' 1"  (1.549 m)   Wt 86.6 kg  SpO2 97%   BMI 36.09 kg/m   Physical Exam Vitals and nursing note reviewed.  Constitutional:      Appearance: She is well-developed.  HENT:     Head: Normocephalic and atraumatic.  Eyes:     Pupils: Pupils are equal, round, and reactive to light.     Right eye: Pupil is round and reactive.     Left eye: Pupil is round and reactive.  Cardiovascular:     Rate and Rhythm: Normal rate.  Pulmonary:     Effort: Pulmonary effort is normal.     Breath sounds: No wheezing or rales.  Abdominal:     Palpations: Abdomen is soft.  Musculoskeletal:     Cervical back: Normal range of motion and neck supple.  Skin:    General: Skin is warm and dry.  Neurological:     Mental Status: She is alert and oriented to person, place, and time.     GCS: GCS eye subscore is 4. GCS verbal subscore is 5. GCS motor subscore is 6.     Comments: Left sided weakness noted to upper and lower extremities.      ED Results / Procedures / Treatments   Labs (all labs ordered are listed, but only abnormal results are displayed) Labs Reviewed  CBC WITH DIFFERENTIAL/PLATELET - Abnormal; Notable for the following components:      Result Value   RDW 17.0 (*)    Lymphs Abs 4.4 (*)    All other components within normal limits   COMPREHENSIVE METABOLIC PANEL - Abnormal; Notable for the following components:   BUN 29 (*)    All other components within normal limits    EKG EKG Interpretation  Date/Time:  Thursday January 09 2020 17:13:04 EST Ventricular Rate:  73 PR Interval:    QRS Duration: 87 QT Interval:  386 QTC Calculation: 426 R Axis:   -2 Text Interpretation: Sinus rhythm Low voltage, extremity and precordial leads no acute ST/T changes no significant change since 2020 Confirmed by Sherwood Gambler [phone]) on 01/09/2020 6:50:56 PM   Radiology CT Head Wo Contrast  Result Date: 01/09/2020 CLINICAL DATA:  Left head pain EXAM: CT HEAD WITHOUT CONTRAST TECHNIQUE: Contiguous axial images were obtained from the base of the skull through the vertex without intravenous contrast. COMPARISON:  07/21/2019 FINDINGS: Brain: There is no acute intracranial hemorrhage, mass effect, or edema. There is no new loss of gray differentiation. Small chronic right frontal white matter infarct is again noted. Additional patchy hypoattenuation in the supratentorial white matter is nonspecific but may reflect mild chronic microvascular ischemic changes. There is no extra-axial fluid collection. Ventricles and sulci are within normal limits in size and configuration. Vascular: There is mild atherosclerotic calcification at the skull base. Skull: Calvarium is unremarkable. Sinuses/Orbits: No acute finding. Other: None. IMPRESSION: No acute abnormality.  Stable chronic findings detailed above. Electronically Signed   By: Macy Mis M.D.   On: 01/09/2020 18:32    Procedures Procedures (including critical care time)  Medications Ordered in ED Medications  sodium chloride 0.9 % bolus 500 mL (0 mLs Intravenous Stopped 01/09/20 2019)  prochlorperazine (COMPAZINE) injection 10 mg (10 mg Intravenous Given 01/09/20 1855)  diphenhydrAMINE (BENADRYL) injection 25 mg (25 mg Intravenous Given 01/09/20 1853)    ED Course  I have reviewed the  triage vital signs and the nursing notes.  Pertinent labs & imaging results that were available during my care of the patient were reviewed by me and considered in my medical  decision making (see chart for details).    MDM Rules/Calculators/A&P  Patient with a past medical history of cervicalgia, stroke presents to the ED with complaints of headaches since noon today.  Reports there are new onset of pressure to the sacral area with radiation to the front of her head.  Headache feels somewhat recurrent to her previous history.  She was seen by her spine specialist who provides her with steroid injections on Tuesday.  Reports feeling improvement of her neck pain. However, has taken OTC medications without improvement in symptoms.  During my evaluation patient is neurologically intact, she is ambulatory in the ED with a steady gait.  She does have left-sided deficits which are consistent with her prior history of stroke.  She reports this is at baseline, she has been receiving therapy for this.  Interpretation of her labs reveal an elevated BUN on her CMP highly consistent with some mild dehydration, no electrolyte derangement.  Creatinine function is within normal limits.  CBC is without any leukocytosis, no signs of anemia.  CT of her head without any intracranial hemorrhage, acute process.  She provided with a headache cocktail.  Reports some improvement in her symptoms.  No trauma, fever, neck rigidity, lower suspicion for meningitis. No sudden onset of headache without red flags such as blurry vision or dizziness.  9:18 PM patient reassessed by me, reports improvement in her headaches along with resolution of her symptoms after headache cocktail.  She does have follow-up with PCP.  Return precautions discussed at length.  I have discussed case with Dr. Renette Butters who has seen patient and agrees with plan and management.  Portions of this note were generated with Scientist, clinical (histocompatibility and immunogenetics). Dictation  errors may occur despite best attempts at proofreading.  Final Clinical Impression(s) / ED Diagnoses Final diagnoses:  Bad headache    Rx / DC Orders ED Discharge Orders    None       Claude Manges, Cordelia Poche 01/09/20 2119    Pricilla Loveless, MD 01/09/20 2315

## 2020-01-23 ENCOUNTER — Other Ambulatory Visit: Payer: Self-pay | Admitting: Pediatrics

## 2020-01-27 ENCOUNTER — Ambulatory Visit: Payer: Medicare Other | Admitting: Family Medicine

## 2020-05-14 ENCOUNTER — Encounter (HOSPITAL_BASED_OUTPATIENT_CLINIC_OR_DEPARTMENT_OTHER): Payer: Self-pay | Admitting: Emergency Medicine

## 2020-05-14 ENCOUNTER — Other Ambulatory Visit: Payer: Self-pay

## 2020-05-14 ENCOUNTER — Emergency Department (HOSPITAL_COMMUNITY): Payer: Medicare Other

## 2020-05-14 ENCOUNTER — Emergency Department (HOSPITAL_BASED_OUTPATIENT_CLINIC_OR_DEPARTMENT_OTHER)
Admission: EM | Admit: 2020-05-14 | Discharge: 2020-05-15 | Disposition: A | Discharge status: Home / Self Care | Payer: Medicare Other | Attending: Emergency Medicine | Admitting: Emergency Medicine

## 2020-05-14 DIAGNOSIS — E1140 Type 2 diabetes mellitus with diabetic neuropathy, unspecified: Secondary | ICD-10-CM | POA: Insufficient documentation

## 2020-05-14 DIAGNOSIS — Z7984 Long term (current) use of oral hypoglycemic drugs: Secondary | ICD-10-CM | POA: Diagnosis not present

## 2020-05-14 DIAGNOSIS — I10 Essential (primary) hypertension: Secondary | ICD-10-CM | POA: Insufficient documentation

## 2020-05-14 DIAGNOSIS — Z8673 Personal history of transient ischemic attack (TIA), and cerebral infarction without residual deficits: Secondary | ICD-10-CM | POA: Insufficient documentation

## 2020-05-14 DIAGNOSIS — R42 Dizziness and giddiness: Secondary | ICD-10-CM | POA: Diagnosis present

## 2020-05-14 DIAGNOSIS — Z7901 Long term (current) use of anticoagulants: Secondary | ICD-10-CM | POA: Insufficient documentation

## 2020-05-14 DIAGNOSIS — Z79899 Other long term (current) drug therapy: Secondary | ICD-10-CM | POA: Diagnosis not present

## 2020-05-14 DIAGNOSIS — E119 Type 2 diabetes mellitus without complications: Secondary | ICD-10-CM | POA: Diagnosis not present

## 2020-05-14 HISTORY — DX: Cerebral infarction, unspecified: I63.9

## 2020-05-14 LAB — CBG MONITORING, ED
Glucose-Capillary: 95 mg/dL (ref 70–99)
Glucose-Capillary: 64 mg/dL — ABNORMAL LOW (ref 70–99)
Glucose-Capillary: 68 mg/dL — ABNORMAL LOW (ref 70–99)

## 2020-05-14 MED ORDER — MECLIZINE HCL 12.5 MG PO TABS: 12.5000 mg | ORAL_TABLET | Freq: Three times a day (TID) | ORAL | 0 refills | Status: AC

## 2020-05-14 MED ORDER — LORAZEPAM 1 MG PO TABS
1.0000 mg | ORAL_TABLET | Freq: Once | ORAL | Status: AC | PRN
  Administered 2020-05-14: 1 mg via ORAL
  Filled 2020-05-14: qty 1

## 2020-05-14 MED ORDER — LORAZEPAM 2 MG/ML IJ SOLN
1.0000 mg | Freq: Once | INTRAMUSCULAR | Status: DC
  Filled 2020-05-14: qty 1

## 2020-05-14 MED ORDER — DEXTROSE 50 % IV SOLN
INTRAVENOUS | Status: AC
  Administered 2020-05-14: 50 mL
  Filled 2020-05-14: qty 50

## 2020-05-14 MED ORDER — LORAZEPAM 2 MG/ML IJ SOLN
1.0000 mg | Freq: Once | INTRAMUSCULAR | Status: AC
  Administered 2020-05-14: 1 mg via INTRAVENOUS
  Filled 2020-05-14: qty 1

## 2020-05-14 NOTE — ED Provider Notes (Signed)
11:28 PM Patient awake, alert, in no distress. She has had MRI, I discussed the results with her, no evidence for acute stroke. Per prior evaluation, patient appropriate for discharge with outpatient follow-up.  Patient amenable to calling her physician tomorrow, will be started on meclizine on discharge.   Gerhard Munch, MD 05/14/20 2328

## 2020-05-14 NOTE — ED Notes (Signed)
ED Provider at bedside. 

## 2020-05-14 NOTE — ED Triage Notes (Addendum)
Dizziness yesterday morning, went to [facility] but was not seen. Denies dizziness at present. Alert, amb. States hx of vertigo. Speech clear. No obvious nuero deficits. CVA in April per pt. States some residual left sided weaknes.Donna Frank

## 2020-05-14 NOTE — Discharge Instructions (Addendum)
As discussed, your evaluation today has been largely reassuring.  But, it is important that you monitor your condition carefully, and do not hesitate to return to the ED if you develop new, or concerning changes in your condition. ? ?Otherwise, please follow-up with your physician for appropriate ongoing care. ? ?

## 2020-05-14 NOTE — ED Provider Notes (Signed)
[facility] EMERGENCY DEPARTMENT Provider Note   CSN: [id number] Arrival date & time: 05/14/20  1552     History Chief Complaint  Patient presents with  . Dizziness    Donna Frank is a 74 y.o. female.  HPI Patient is a 74 year old female with a past medical history of stroke in April currently on Plavix no longer on aspirin, DM, hiatal hernia, HLD, HTN, peripheral vertigo historically on meclizine not currently on any.  Patient is presented today with acute onset of vertiginous symptoms that began yesterday morning.  She states that the lasted for approximately 5 minutes after she sat up in bed.  She states that they were severe but not associated any nausea or vomiting.  She denies any chest pain or shortness of breath any syncope or near syncope.  She states that when she got up after that she felt that she has some disequilibrium.  She felt unstable on her feet and was concerned that there was something "wrong with my head ".  Patient states that she had a stroke in April and that her symptoms are primarily left-sided weakness.  She states that she still has some residual left-sided weakness which is not changed today.  Patient states that she has occasionally had ringing in her ears but denies any currently.  She states that she does not feel of the room spinning currently but she states that she still feels that when she walks she feels "unsteady ".    Past Medical History:  Diagnosis Date  . Asthma   . CVA (cerebral vascular accident) (HCC)   . Diabetes mellitus without complication (HCC)   . Hiatal hernia   . Hyperlipidemia   . Hypertension   . Pinched nerve in neck    neck and shoulder  . Pre-diabetes   . Reflux   . Stroke (HCC)   . Vertigo     Patient Active Problem List   Diagnosis Date Noted  . Mild persistent asthma without complication 12/03/2018  . Other allergic rhinitis 12/03/2018  . Anaphylactic shock due to adverse food reaction 12/03/2018  .  Gastroesophageal reflux disease without esophagitis 12/03/2018  . Essential hypertension 12/03/2018  . Prediabetes 12/03/2018  . History of glaucoma 12/03/2018  . Allergic reaction to insect sting 12/03/2018  . Diabetic neuropathy, type II diabetes mellitus (HCC) 01/27/2015  . Extrinsic asthma 09/11/2014  . Hammer toe, acquired 11/27/2013  . Unspecified hereditary and idiopathic peripheral neuropathy 07/23/2013  . Bunion of great toe 07/23/2013    Past Surgical History:  Procedure Laterality Date  . ABDOMINAL HYSTERECTOMY     partial  . CERVICAL DISC SURGERY  2017  . KNEE SURGERY  2019   replacement     OB History   No obstetric history on file.     Family History  Problem Relation Age of Onset  . Emphysema Maternal Grandfather   . COPD Mother   . Heart disease Maternal Uncle   . Heart disease Maternal Aunt   . Rheum arthritis Sister   . Asthma Granddaughter   . Allergic rhinitis Granddaughter   . Asthma Grandson   . Allergic rhinitis Grandson   . Lupus Niece   . Immunodeficiency Neg Hx   . Angioedema Neg Hx   . Atopy Neg Hx   . Urticaria Neg Hx     Social History   Tobacco Use  . Smoking status: Former Smoker    Packs/day: 0.50    Years: 35.00    Pack  years: 17.50    Quit date: 12/21/2008    Years since quitting: 11.4  . Smokeless tobacco: Never Used  Vaping Use  . Vaping Use: Never used  Substance Use Topics  . Alcohol use: No    Alcohol/week: 0.0 standard drinks  . Drug use: No    Home Medications Prior to Admission medications   Medication Sig Start Date End Date Taking? Authorizing Provider  albuterol (PROAIR HFA) 108 (90 Base) MCG/ACT inhaler Inhale 2 puffs into the lungs every 6 (six) hours as needed for wheezing or shortness of breath. 05/13/19  Yes Bardelas, Bonnita Hollow, MD  albuterol (PROVENTIL) (2.5 MG/3ML) 0.083% nebulizer solution Take 2.5 mg by nebulization every 6 (six) hours as needed for wheezing or shortness of breath.   Yes [provider]  Alcaftadine (LASTACAFT) 0.25 % SOLN Apply to eye.   Yes [provider]  cholecalciferol (VITAMIN D) 1000 UNITS tablet Take 1,000 Units by mouth daily.   Yes [provider]  diazepam (VALIUM) 2 MG tablet TAKE 1 TABLET BY MOUTH 2 TIMES DAILY AS NEEDED FOR ANXIETY. 06/16/17  Yes [provider]  DULoxetine (CYMBALTA) 60 MG capsule Take 60 mg by mouth daily.   Yes [provider]  EPINEPHrine (EPIPEN 2-PAK) 0.3 mg/0.3 mL IJ SOAJ injection Use for severe allergic reactions 12/03/18  Yes Bardelas, Jose A, MD  Esomeprazole Magnesium (NEXIUM PO) Take by mouth.   Yes [provider]  FLOVENT HFA 110 MCG/ACT inhaler INHALE 1 PUFF INTO THE LUNGS 2 (TWO) TIMES DAILY. TO PREVENT COUGHING OR WHEEZING. 10/22/19  Yes Bardelas, Jose A, MD  lisinopril (PRINIVIL,ZESTRIL) 10 MG tablet Take 40 mg by mouth daily.    Yes [provider]  metFORMIN (GLUCOPHAGE) 500 MG tablet Take 500 mg by mouth. Half tab with meal at evening   Yes [provider]  montelukast (SINGULAIR) 10 MG tablet TAKE 1 TABLET (10 MG TOTAL) BY MOUTH AT BEDTIME. TO PREVENT COUGHING OR WHEEZING. 05/29/19  Yes Bardelas, Jose A, MD  pravastatin (PRAVACHOL) 40 MG tablet Take 40 mg by mouth daily.   Yes [provider]  acetaminophen (TYLENOL) 325 MG tablet Take by mouth. 03/10/18   [provider]  aspirin 81 MG chewable tablet Chew 4 tablets (324 mg total) by mouth daily. 12/20/14   Gerhard Munch, MD  cephALEXin (KEFLEX) 500 MG capsule Take 1 capsule (500 mg total) by mouth 3 (three) times daily. 01/04/19   Azalia Bilis, MD  clobetasol (TEMOVATE) 0.05 % external solution 1(ONE) APPLICATION(S) SCALP 2(TWO) TIMES A DAY 11/14/18   [provider]    Allergies    Naproxen sodium, Shellfish allergy, Codeine, Iodine, Prednisone, Sulfa antibiotics, and Levofloxacin  Review of Systems   Review of Systems  Constitutional: Negative for chills and fever.   HENT: Negative for congestion.   Eyes: Negative for pain.  Respiratory: Negative for cough and shortness of breath.   Cardiovascular: Negative for chest pain and leg swelling.  Gastrointestinal: Negative for abdominal pain, diarrhea, nausea and vomiting.  Genitourinary: Negative for dysuria.  Musculoskeletal: Negative for myalgias.  Skin: Negative for rash.  Neurological: Positive for dizziness and weakness (chronic left sided). Negative for headaches.    Physical Exam Updated Vital Signs BP (!) 108/54 (BP Location: Right Arm)   Pulse 66   Temp 98.4 F (36.9 C) (Oral)   Resp 18   SpO2 96%   Physical Exam Vitals and nursing note reviewed.  Constitutional:      General:  She is not in acute distress. HENT:     Head: Normocephalic and atraumatic.     Nose: Nose normal.  Eyes:     General: No scleral icterus. Cardiovascular:     Rate and Rhythm: Normal rate and regular rhythm.     Pulses: Normal pulses.     Heart sounds: Normal heart sounds.  Pulmonary:     Effort: Pulmonary effort is normal. No respiratory distress.     Breath sounds: No wheezing.  Abdominal:     Palpations: Abdomen is soft.     Tenderness: There is no abdominal tenderness.  Musculoskeletal:     Cervical back: Normal range of motion.     Right lower leg: No edema.     Left lower leg: No edema.  Skin:    General: Skin is warm and dry.     Capillary Refill: Capillary refill takes less than 2 seconds.  Neurological:     Mental Status: She is alert. Mental status is at baseline.     Comments: Alert and oriented to self, place, time and event.   Speech is fluent, clear without dysarthria or dysphasia.   Strength 5/5 in right upper and lower extremities; strength 5-/5 with extension of left arm, 5/5 in LLE.  Sensation intact in upper/lower extremities   Normal gait.  Negative Romberg. No pronator drift.  Normal finger-to-nose and feet tapping.  CN I not tested  CN II grossly intact visual fields  bilaterally. Did not visualize posterior eye.   CN III, IV, VI PERRLA and EOMs intact bilaterally  CN V Intact sensation to sharp and light touch to the face  CN VII facial movements symmetric  CN VIII not tested  CN IX, X no uvula deviation, symmetric rise of soft palate  CN XI 5/5 SCM and trapezius strength bilaterally  CN XII Midline tongue protrusion, symmetric L/R movements   Psychiatric:        Mood and Affect: Mood normal.        Behavior: Behavior normal.     ED Results / Procedures / Treatments   Labs (all labs ordered are listed, but only abnormal results are displayed) Labs Reviewed  CBG MONITORING, ED    EKG EKG Interpretation  Date/Time:  Thursday May 14 2020 16:02:21 EDT Ventricular Rate:  81 PR Interval:  146 QRS Duration: 86 QT Interval:  368 QTC Calculation: 427 R Axis:   -12 Text Interpretation: Normal sinus rhythm Low voltage QRS Cannot rule out Anterior infarct , age undetermined Abnormal ECG No significant change since last tracing Confirmed by Melene Plan [phone]) on 05/14/2020 4:13:31 PM   Radiology No results found.  Procedures Procedures (including critical care time) Results for orders placed or performed during the hospital encounter of 05/14/20  POC CBG, ED  Result Value Ref Range   Glucose-Capillary 95 70 - 99 mg/dL   Comment 1 Document in Chart    No results found.  Medications Ordered in ED Medications  LORazepam (ATIVAN) tablet 1 mg (has no administration in time range)    ED Course  I have reviewed the triage vital signs and the nursing notes.  Pertinent labs & imaging results that were available during my care of the patient were reviewed by me and considered in my medical decision making (see chart for details).    MDM Rules/Calculators/A&P                          Patient  74 year old female presented today with disequilibrium which has been constant for greater than 36 hours.  She states that she has felt the  disequilibrium since she woke up yesterday and felt an episode of vertiginous symptoms which seems to have resolved.  She was seen at [facility] regional however left after CT scan, lab work and EKG were done before being seen by a provider.  Patient physical exam is reassuring.  She is ambulatory without any evidence of balance issues.  Cerebellar intact.  Has no neurologic abnormalities.  I reviewed patient's head CT which is without any acute abnormality there is evidence of chronic infarct--no significant change from prior.  The chronic infarct is in the right frontal white matter. Her lab work was also unremarkable with normal electrolytes on her BMP.  CBC without leukocytosis or anemia.  Coags were normal and troponin next to within normal limits.  Will not repeat this work-up at this time.  Discussed with patient my attending Dr. Adela Lank plan to either discharge and have follow-up with neurology or transfer to [facility] for MRI.  Patient states that she would prefer to have MRI done.  I discussed this case with my attending physician who cosigned this note including patient's presenting symptoms, physical exam, and planned diagnostics and interventions. Attending physician stated agreement with plan or made changes to plan which were implemented.   Attending physician assessed patient at bedside.  I discussed with Dr. Rubin Payor who accepts patient to [facility] for MRI.  Plan is to discharge with meclizine versus Ativan for peripheral vertigo as long as MRI is within normal notes.  Patient is understanding of plan.  Final Clinical Impression(s) / ED Diagnoses Final diagnoses:  Dysequilibrium    Rx / DC Orders ED Discharge Orders    None       Gailen Shelter, [place] 05/14/20 1749    Melene Plan, DO 05/14/20 1945

## 2020-05-14 NOTE — ED Notes (Signed)
[facility] called for transport. Spoke with [employer].

## 2020-05-14 NOTE — ED Notes (Signed)
[facility] here to get pt and take  Pt to  [facility] for MRI

## 2020-05-14 NOTE — Medical Student Note (Signed)
[facility] Provider Student Note For educational purposes for Medical, PA and NP students only and not part of the legal medical record.   CSN: [id number] Arrival date & time: 05/14/20  1552      History   Chief Complaint Chief Complaint  Patient presents with  . Dizziness    HPI Donna Frank is a 74 y.o. female presenting with dizziness and an episode of vertigo yesterday. She reports that she went to the [facility] ED but left because she had been waiting for too long. She says that she had blood work, EKG, and head CT done there but was unable to get her results because she left. She had one episode of vertigo yesterday morning when getting out of bed. She reports that everything in the room was spinning around her, so she immediately lied back down in bed and her vertigo improved after 5 minutes of lying down. She took no medication. She reports that since this single episode of vertigo, she has had a constant feeling of dizziness that she describes as feeling "off balance"- she reports being able to walk and feels "off". She denies falling or hitting her head.   Head CT from ED visit yesterday showed no acute intracranial abnormality. Lab work significant for  She has history of CVA in April of 2021 and is on Plavix. Hx of left sided weakness residual from CVA. Currently following neurology.  HPI  Past Medical History:  Diagnosis Date  . Asthma   . CVA (cerebral vascular accident) (HCC)   . Diabetes mellitus without complication (HCC)   . Hiatal hernia   . Hyperlipidemia   . Hypertension   . Pinched nerve in neck    neck and shoulder  . Pre-diabetes   . Reflux   . Stroke (HCC)   . Vertigo     Patient Active Problem List   Diagnosis Date Noted  . Mild persistent asthma without complication 12/03/2018  . Other allergic rhinitis 12/03/2018  . Anaphylactic shock due to adverse food reaction 12/03/2018  . Gastroesophageal reflux disease without esophagitis  12/03/2018  . Essential hypertension 12/03/2018  . Prediabetes 12/03/2018  . History of glaucoma 12/03/2018  . Allergic reaction to insect sting 12/03/2018  . Diabetic neuropathy, type II diabetes mellitus (HCC) 01/27/2015  . Extrinsic asthma 09/11/2014  . Hammer toe, acquired 11/27/2013  . Unspecified hereditary and idiopathic peripheral neuropathy 07/23/2013  . Bunion of great toe 07/23/2013    Past Surgical History:  Procedure Laterality Date  . ABDOMINAL HYSTERECTOMY     partial  . CERVICAL DISC SURGERY  2017  . KNEE SURGERY  2019   replacement    OB History   No obstetric history on file.      Home Medications    Prior to Admission medications   Medication Sig Start Date End Date Taking? Authorizing Provider  acetaminophen (TYLENOL) 325 MG tablet Take by mouth. 03/10/18   [provider]  albuterol (PROAIR HFA) 108 (90 Base) MCG/ACT inhaler Inhale 2 puffs into the lungs every 6 (six) hours as needed for wheezing or shortness of breath. 05/13/19   Fletcher Anon, MD  albuterol (PROVENTIL) (2.5 MG/3ML) 0.083% nebulizer solution Take 2.5 mg by nebulization every 6 (six) hours as needed for wheezing or shortness of breath.    [provider]  Alcaftadine (LASTACAFT) 0.25 % SOLN Apply to eye.    [provider]  aspirin 81 MG chewable tablet Chew 4 tablets (324 mg  total) by mouth daily. 12/20/14   Gerhard Munch, MD  cephALEXin (KEFLEX) 500 MG capsule Take 1 capsule (500 mg total) by mouth 3 (three) times daily. 01/04/19   Azalia Bilis, MD  cholecalciferol (VITAMIN D) 1000 UNITS tablet Take 1,000 Units by mouth daily.    [provider]  clobetasol (TEMOVATE) 0.05 % external solution 1(ONE) APPLICATION(S) SCALP 2(TWO) TIMES A DAY 11/14/18   [provider]  diazepam (VALIUM) 2 MG tablet TAKE 1 TABLET BY MOUTH 2 TIMES DAILY AS NEEDED FOR ANXIETY. 06/16/17   [provider]  DULoxetine (CYMBALTA) 60 MG capsule Take 60 mg by  mouth daily.    [provider]  EPINEPHrine (EPIPEN 2-PAK) 0.3 mg/0.3 mL IJ SOAJ injection Use for severe allergic reactions 12/03/18   Fletcher Anon, MD  Esomeprazole Magnesium (NEXIUM PO) Take by mouth.    [provider]  FLOVENT HFA 110 MCG/ACT inhaler INHALE 1 PUFF INTO THE LUNGS 2 (TWO) TIMES DAILY. TO PREVENT COUGHING OR WHEEZING. 10/22/19   Fletcher Anon, MD  lisinopril (PRINIVIL,ZESTRIL) 10 MG tablet Take 40 mg by mouth daily.     [provider]  metFORMIN (GLUCOPHAGE) 500 MG tablet Take 500 mg by mouth. Half tab with meal at evening    [provider]  montelukast (SINGULAIR) 10 MG tablet TAKE 1 TABLET (10 MG TOTAL) BY MOUTH AT BEDTIME. TO PREVENT COUGHING OR WHEEZING. 05/29/19   Fletcher Anon, MD  pravastatin (PRAVACHOL) 40 MG tablet Take 40 mg by mouth daily.    [provider]    Family History Family History  Problem Relation Age of Onset  . Emphysema Maternal Grandfather   . COPD Mother   . Heart disease Maternal Uncle   . Heart disease Maternal Aunt   . Rheum arthritis Sister   . Asthma Granddaughter   . Allergic rhinitis Granddaughter   . Asthma Grandson   . Allergic rhinitis Grandson   . Lupus Niece   . Immunodeficiency Neg Hx   . Angioedema Neg Hx   . Atopy Neg Hx   . Urticaria Neg Hx     Social History Social History   Tobacco Use  . Smoking status: Former Smoker    Packs/day: 0.50    Years: 35.00    Pack years: 17.50    Quit date: 12/21/2008    Years since quitting: 11.4  . Smokeless tobacco: Never Used  Vaping Use  . Vaping Use: Never used  Substance Use Topics  . Alcohol use: No    Alcohol/week: 0.0 standard drinks  . Drug use: No     Allergies   Naproxen sodium, Shellfish allergy, Codeine, Iodine, Prednisone, Sulfa antibiotics, and Levofloxacin   Review of Systems Review of Systems  Constitutional: Positive for fatigue. Negative for activity change.  HENT: Positive for tinnitus (reports  intermittent ear ringing). Negative for hearing loss and trouble swallowing.   Eyes: Negative for redness and visual disturbance.  Respiratory: Negative for cough and shortness of breath.   Cardiovascular: Negative for chest pain and palpitations.  Gastrointestinal: Negative for abdominal pain.  Musculoskeletal: Negative for back pain. Gait problem: hx recently feeling unsteady. Neck pain: hx of.  Neurological: Positive for dizziness ("unsteady"). Negative for syncope. Weakness: hx residual weakness from CVA.  Psychiatric/Behavioral: Negative for confusion.     Physical Exam Updated Vital Signs BP (!) 106/93 (BP Location: Right Arm)   Pulse 76   Temp 98.4 F (36.9 C) (Oral)   Resp 16  SpO2 99%   Physical Exam Vitals reviewed.  Constitutional:      General: She is not in acute distress.    Appearance: Normal appearance. She is not ill-appearing or toxic-appearing.  HENT:     Head: Normocephalic.     Mouth/Throat:     Mouth: Mucous membranes are moist.     Pharynx: Oropharynx is clear.  Eyes:     Extraocular Movements: Extraocular movements intact.     Pupils: Pupils are equal, round, and reactive to light.  Cardiovascular:     Rate and Rhythm: Normal rate and regular rhythm.     Pulses: Normal pulses.     Heart sounds: Normal heart sounds.  Pulmonary:     Effort: Pulmonary effort is normal.     Breath sounds: Normal breath sounds.  Abdominal:     General: Abdomen is flat.     Palpations: Abdomen is soft.  Musculoskeletal:        General: No swelling.     Cervical back: Normal range of motion and neck supple.     Right lower leg: No edema.     Left lower leg: No edema.  Skin:    General: Skin is warm and dry.  Neurological:     Mental Status: She is alert and oriented to person, place, and time.     Cranial Nerves: Cranial nerves are intact.     Sensory: Sensation is intact.     Motor: No weakness, tremor, abnormal muscle tone or pronator drift.     Coordination:  Romberg sign negative. Finger-Nose-Finger Test and Heel to [facility] Test normal. Rapid alternating movements normal.     Gait: Gait normal.      ED Treatments / Results  Labs (all labs ordered are listed, but only abnormal results are displayed) Labs Reviewed - No data to display  EKG  Radiology No results found.  Procedures Procedures (including critical care time)  Medications Ordered in ED Medications - No data to display   Initial Impression / Assessment and Plan / ED Course  I have reviewed the triage vital signs and the nursing notes.  Pertinent labs & imaging results that were available during my care of the patient were reviewed by me and considered in my medical decision making (see chart for details).     Pt seen and examined. Records were obtained from ED visit yesterday to [facility] and CT head negative for acute intracranial abnormality, and CBC and CMP no pertinent findings from yesterday ED labs. Pt neurological exam pertinent positive findings including residual left sided upper extremity weakness that she reports as unchanged from prior CVA.  Discussed case with MD on staff and recommends shared decision making with pt regarding further workup of MRI brain for symptoms. Pt elects to go to [facility] for MRI for further workup given history of CVA in April 2021. Will transfer pt by ambulance to [facility] for further workup. Pt educated that she may experience a longer wait similar to the wait time she experienced yesterday that resulted in her leaving the ED. Also discussed continued follow up with neurologist, and educated pt that her neurological exam was reassuring.   Final Clinical Impressions(s) / ED Diagnoses   Final diagnoses:  None    New Prescriptions New Prescriptions   No medications on file

## 2020-06-25 ENCOUNTER — Encounter (HOSPITAL_COMMUNITY): Payer: Self-pay | Admitting: *Deleted

## 2020-06-25 ENCOUNTER — Emergency Department (HOSPITAL_COMMUNITY)
Admission: EM | Admit: 2020-06-25 | Discharge: 2020-06-26 | Discharge status: Against Medical Advice | Payer: Medicare Other | Attending: Emergency Medicine | Admitting: Emergency Medicine

## 2020-06-25 ENCOUNTER — Other Ambulatory Visit: Payer: Self-pay

## 2020-06-25 DIAGNOSIS — Z79899 Other long term (current) drug therapy: Secondary | ICD-10-CM | POA: Diagnosis not present

## 2020-06-25 DIAGNOSIS — F332 Major depressive disorder, recurrent severe without psychotic features: Secondary | ICD-10-CM

## 2020-06-25 DIAGNOSIS — Z20822 Contact with and (suspected) exposure to covid-19: Secondary | ICD-10-CM | POA: Insufficient documentation

## 2020-06-25 LAB — COMPREHENSIVE METABOLIC PANEL
ALT: 12 U/L (ref 0–44)
AST: 18 U/L (ref 15–41)
Albumin: 4.1 g/dL (ref 3.5–5.0)
Alkaline Phosphatase: 72 U/L (ref 38–126)
Anion gap: 9 (ref 5–15)
BUN: 21 mg/dL (ref 8–23)
CO2: 27 mmol/L (ref 22–32)
Calcium: 9.2 mg/dL (ref 8.9–10.3)
Chloride: 107 mmol/L (ref 98–111)
Creatinine, Ser: 0.93 mg/dL (ref 0.44–1.00)
GFR calc Af Amer: >60 mL/min (ref >60)
GFR calc non Af Amer: >60 mL/min (ref >60)
Glucose, Bld: 86 mg/dL (ref 70–99)
Potassium: 4.5 mmol/L (ref 3.5–5.1)
Sodium: 143 mmol/L (ref 135–145)
Total Bilirubin: 0.2 mg/dL — ABNORMAL LOW (ref 0.3–1.2)
Total Protein: 7.2 g/dL (ref 6.5–8.1)

## 2020-06-25 LAB — CBG MONITORING, ED: Glucose-Capillary: 77 mg/dL (ref 70–99)

## 2020-06-25 LAB — CBC
HCT: 39.5 % (ref 36.0–46.0)
Hemoglobin: 12.2 g/dL (ref 12.0–15.0)
MCH: 26.2 pg (ref 26.0–34.0)
MCHC: 30.9 g/dL (ref 30.0–36.0)
MCV: 84.9 fL (ref 80.0–100.0)
Platelets: 325 10*3/uL (ref 150–400)
RBC: 4.65 MIL/uL (ref 3.87–5.11)
RDW: 17.6 % — ABNORMAL HIGH (ref 11.5–15.5)
WBC: 6.6 10*3/uL (ref 4.0–10.5)
nRBC: 0 % (ref 0.0–0.2)

## 2020-06-25 LAB — SARS CORONAVIRUS 2 BY RT PCR (HOSPITAL ORDER, PERFORMED IN ~~LOC~~ HOSPITAL LAB): SARS Coronavirus 2: NEGATIVE

## 2020-06-25 LAB — ETHANOL: Alcohol, Ethyl (B): <10 mg/dL (ref <10)

## 2020-06-25 MED ORDER — METFORMIN HCL 500 MG PO TABS
500.0000 mg | ORAL_TABLET | Freq: Two times a day (BID) | ORAL | Status: DC
  Filled 2020-06-25 (×2): qty 1

## 2020-06-25 MED ORDER — MONTELUKAST SODIUM 10 MG PO TABS
10.0000 mg | ORAL_TABLET | Freq: Every day | ORAL | Status: DC
  Administered 2020-06-25: 10 mg via ORAL
  Filled 2020-06-25 (×2): qty 1

## 2020-06-25 MED ORDER — PRAVASTATIN SODIUM 20 MG PO TABS
40.0000 mg | ORAL_TABLET | Freq: Every day | ORAL | Status: DC
  Administered 2020-06-25: 40 mg via ORAL
  Filled 2020-06-25 (×2): qty 2

## 2020-06-25 MED ORDER — ACETAMINOPHEN 500 MG PO TABS
500.0000 mg | ORAL_TABLET | Freq: Once | ORAL | Status: AC
  Administered 2020-06-25: 500 mg via ORAL
  Filled 2020-06-25: qty 1

## 2020-06-25 MED ORDER — LISINOPRIL 20 MG PO TABS
40.0000 mg | ORAL_TABLET | Freq: Every day | ORAL | Status: DC
  Administered 2020-06-26: 40 mg via ORAL
  Filled 2020-06-25: qty 2

## 2020-06-25 MED ORDER — DULOXETINE HCL 30 MG PO CPEP
60.0000 mg | ORAL_CAPSULE | Freq: Every day | ORAL | Status: DC
  Administered 2020-06-26: 60 mg via ORAL
  Filled 2020-06-25: qty 2

## 2020-06-25 MED ORDER — DIAZEPAM 2 MG PO TABS
2.0000 mg | ORAL_TABLET | Freq: Two times a day (BID) | ORAL | Status: DC | PRN
  Administered 2020-06-26: 2 mg via ORAL
  Filled 2020-06-25: qty 1

## 2020-06-25 NOTE — ED Triage Notes (Signed)
There was times "I wish I just wasn't around, but I would not take a gun and shoot myself." States she is "down and depressed'. Pt reports she is "having family problems right now".

## 2020-06-25 NOTE — ED Provider Notes (Signed)
[facility]-EMERGENCY DEPT Provider Note   CSN: [id number] Arrival date & time: 06/25/20  1919     History Chief Complaint  Patient presents with  . Depression    Donna Frank is a 74 y.o. female with no relevant past medical history presents the ED with complaints of depression.   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 Plan was for continued benzodiazepines for panic attacks and breakthrough symptoms as well as referral for CBT.    On my examination, patient states that for the past few days she has been experiencing worsening of her major depressive disorder.  She states that she has been having difficulty with her family, particularly her children.  She lives alone in an apartment.  While she denies any overt suicidal ideation, she states that if she were to go to sleep and "not wake up" she would not be upset.  She states that she was in her vehicle earlier and was so overwhelmed by her depression that she felt as though she could not move.  She called her therapist/psychologist who recommended that she come to the ED for evaluation while she work on finding her outpatient placement.  While patient admits to talking to herself, she denies any AVH.  She also denies any HI, alcohol use, or any illicit drug use.  Medically, aside from chronic pain symptoms, she denies any medical complaints.  No recent illness or infection, fevers or chills, chest pain or shortness of breath, or other symptoms.  HPI     Past Medical History:  Diagnosis Date  . Asthma   . CVA (cerebral vascular accident) (HCC)   . Diabetes mellitus without complication (HCC)   . Hiatal hernia   . Hyperlipidemia   . Hypertension   . Pinched nerve in neck    neck and shoulder  . Pre-diabetes   . Reflux   . Stroke (HCC)   . Vertigo     Patient Active Problem  List   Diagnosis Date Noted  . Mild persistent asthma without complication 12/03/2018  . Other allergic rhinitis 12/03/2018  . Anaphylactic shock due to adverse food reaction 12/03/2018  . Gastroesophageal reflux disease without esophagitis 12/03/2018  . Essential hypertension 12/03/2018  . Prediabetes 12/03/2018  . History of glaucoma 12/03/2018  . Allergic reaction to insect sting 12/03/2018  . Diabetic neuropathy, type II diabetes mellitus (HCC) 01/27/2015  . Extrinsic asthma 09/11/2014  . Hammer toe, acquired 11/27/2013  . Unspecified hereditary and idiopathic peripheral neuropathy 07/23/2013  . Bunion of great toe 07/23/2013    Past Surgical History:  Procedure Laterality Date  . ABDOMINAL HYSTERECTOMY     partial  . CERVICAL DISC SURGERY  2017  . KNEE SURGERY  2019   replacement     OB History   No obstetric history on file.     Family History  Problem Relation Age of Onset  . Emphysema Maternal Grandfather   . COPD Mother   . Heart disease Maternal Uncle   . Heart disease Maternal Aunt   . Rheum arthritis Sister   . Asthma Granddaughter   . Allergic rhinitis Granddaughter   . Asthma Grandson   . Allergic rhinitis Grandson   . Lupus Niece   . Immunodeficiency Neg Hx   . Angioedema Neg Hx   . Atopy Neg Hx   . Urticaria Neg Hx  Social History   Tobacco Use  . Smoking status: Former Smoker    Packs/day: 0.50    Years: 35.00    Pack years: 17.50    Quit date: 12/21/2008    Years since quitting: 11.5  . Smokeless tobacco: Never Used  Vaping Use  . Vaping Use: Never used  Substance Use Topics  . Alcohol use: No    Alcohol/week: 0.0 standard drinks  . Drug use: No    Home Medications Prior to Admission medications   Medication Sig Start Date End Date Taking? Authorizing Provider  acetaminophen (TYLENOL) 325 MG tablet Take by mouth. 03/10/18   [provider]  albuterol (PROAIR HFA) 108 (90 Base) MCG/ACT inhaler Inhale 2 puffs into the  lungs every 6 (six) hours as needed for wheezing or shortness of breath. 05/13/19   Fletcher AnonBardelas, Jose A, MD  albuterol (PROVENTIL) (2.5 MG/3ML) 0.083% nebulizer solution Take 2.5 mg by nebulization every 6 (six) hours as needed for wheezing or shortness of breath.    [provider]  Alcaftadine (LASTACAFT) 0.25 % SOLN Apply to eye.    [provider]  aspirin 81 MG chewable tablet Chew 4 tablets (324 mg total) by mouth daily. 12/20/14   Gerhard MunchLockwood, Robert, MD  cephALEXin (KEFLEX) 500 MG capsule Take 1 capsule (500 mg total) by mouth 3 (three) times daily. 01/04/19   Azalia Bilisampos, Kevin, MD  cholecalciferol (VITAMIN D) 1000 UNITS tablet Take 1,000 Units by mouth daily.    [provider]  clobetasol (TEMOVATE) 0.05 % external solution 1(ONE) APPLICATION(S) SCALP 2(TWO) TIMES A DAY 11/14/18   [provider]  diazepam (VALIUM) 2 MG tablet TAKE 1 TABLET BY MOUTH 2 TIMES DAILY AS NEEDED FOR ANXIETY. 06/16/17   [provider]  DULoxetine (CYMBALTA) 60 MG capsule Take 60 mg by mouth daily.    [provider]  EPINEPHrine (EPIPEN 2-PAK) 0.3 mg/0.3 mL IJ SOAJ injection Use for severe allergic reactions 12/03/18   Fletcher AnonBardelas, Jose A, MD  Esomeprazole Magnesium (NEXIUM PO) Take by mouth.    [provider]  FLOVENT HFA 110 MCG/ACT inhaler INHALE 1 PUFF INTO THE LUNGS 2 (TWO) TIMES DAILY. TO PREVENT COUGHING OR WHEEZING. 10/22/19   Fletcher AnonBardelas, Jose A, MD  lisinopril (PRINIVIL,ZESTRIL) 10 MG tablet Take 40 mg by mouth daily.     [provider]  metFORMIN (GLUCOPHAGE) 500 MG tablet Take 500 mg by mouth. Half tab with meal at evening    [provider]  montelukast (SINGULAIR) 10 MG tablet TAKE 1 TABLET (10 MG TOTAL) BY MOUTH AT BEDTIME. TO PREVENT COUGHING OR WHEEZING. 05/29/19   Fletcher AnonBardelas, Jose A, MD  pravastatin (PRAVACHOL) 40 MG tablet Take 40 mg by mouth daily.    [provider]    Allergies    Naproxen sodium, Shellfish allergy, Codeine,  Iodine, Prednisone, Sulfa antibiotics, and Levofloxacin  Review of Systems   Review of Systems  All other systems reviewed and are negative.   Physical Exam Updated Vital Signs BP 133/68 (BP Location: Left Arm)   Pulse 76   Temp 98.9 F (37.2 C) (Oral)   Resp 18   SpO2 97%   Physical Exam Vitals and nursing note reviewed. Exam conducted with a chaperone present.  Constitutional:      General: She is not in acute distress.    Appearance: She is not ill-appearing.     Comments: Morose.  HENT:     Head: Normocephalic and atraumatic.  Eyes:     General:  No scleral icterus.    Conjunctiva/sclera: Conjunctivae normal.  Cardiovascular:     Rate and Rhythm: Normal rate and regular rhythm.     Pulses: Normal pulses.     Heart sounds: Normal heart sounds.  Pulmonary:     Effort: Pulmonary effort is normal. No respiratory distress.     Breath sounds: Normal breath sounds.  Skin:    General: Skin is dry.     Capillary Refill: Capillary refill takes less than 2 seconds.  Neurological:     General: No focal deficit present.     Mental Status: She is alert and oriented to person, place, and time.     GCS: GCS eye subscore is 4. GCS verbal subscore is 5. GCS motor subscore is 6.  Psychiatric:     Comments: Anhedonia.      ED Results / Procedures / Treatments   Labs (all labs ordered are listed, but only abnormal results are displayed) Labs Reviewed  CBC - Abnormal; Notable for the following components:      Result Value   RDW 17.6 (*)    All other components within normal limits  SARS CORONAVIRUS 2 BY RT PCR (HOSPITAL ORDER, PERFORMED IN [facility])  ETHANOL  COMPREHENSIVE METABOLIC PANEL  RAPID URINE DRUG SCREEN, HOSP PERFORMED  SALICYLATE LEVEL  ACETAMINOPHEN LEVEL    EKG None  Radiology No results found.  Procedures Procedures (including critical care time)  Medications Ordered in ED Medications  DULoxetine (CYMBALTA) DR capsule 60 mg (has no  administration in time range)  lisinopril (ZESTRIL) tablet 40 mg (has no administration in time range)  metFORMIN (GLUCOPHAGE) tablet 500 mg (has no administration in time range)  montelukast (SINGULAIR) tablet 10 mg (has no administration in time range)  pravastatin (PRAVACHOL) tablet 40 mg (has no administration in time range)  diazepam (VALIUM) tablet 2 mg (has no administration in time range)    ED Course  I have reviewed the triage vital signs and the nursing notes.  Pertinent labs & imaging results that were available during my care of the patient were reviewed by me and considered in my medical decision making (see chart for details).    MDM Rules/Calculators/A&P                          Patient is presented to the ED with complaints of depression.  She is already been diagnosed with major depressive disorder without psychotic features.  She has been managed appropriately with SNRI medication and CBT with her psychologist/therapist.  However, she reports that she is having worsening symptoms of depression and she feels as though she is at her breaking point.  She denies any overt suicidal ideation or distinct plan, but states that if she were not to wake up, she would be "okay with that".  She also stated that she has a bunch of benzodiazepines at home, but denied any plans to overdose.  Given that she is already received excellent outpatient management with CBT and medication, will prefer that patient be evaluated by TTS to see if she would need inpatient criteria for stabilization.  She denies any medical complaints and her physical exam is benign.  Vital signs are stable and WNL.  Medically cleared.  Will obtain basic medical clearance labs and COVID-19 screening.  Patient has already been vaccinated for COVID-19.  Laboratory work-up is reviewed and unremarkable.  Awaiting TTS evaluation to determine disposition.  While work-up is entirely  complete and patient is medically cleared,  made Harlene Salts PA aware of patient who will continue to follow if necessary.     Final Clinical Impression(s) / ED Diagnoses Final diagnoses:  Severe episode of recurrent major depressive disorder, without psychotic features [facility])    Rx / DC Orders ED Discharge Orders    None       Donna Frank 06/25/20 2203    Mancel Bale, MD 07/08/20 1620
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
## 2020-06-26 ENCOUNTER — Emergency Department (HOSPITAL_COMMUNITY): Payer: Medicare Other

## 2020-06-26 DIAGNOSIS — F332 Major depressive disorder, recurrent severe without psychotic features: Secondary | ICD-10-CM | POA: Diagnosis not present

## 2020-06-26 LAB — RAPID URINE DRUG SCREEN, HOSP PERFORMED
Amphetamines: NOT DETECTED
Barbiturates: NOT DETECTED
Benzodiazepines: POSITIVE — AB
Cocaine: NOT DETECTED
Opiates: NOT DETECTED
Tetrahydrocannabinol: NOT DETECTED

## 2020-06-26 LAB — URINALYSIS, ROUTINE W REFLEX MICROSCOPIC
Bilirubin Urine: NEGATIVE
Glucose, UA: NEGATIVE mg/dL
Hgb urine dipstick: NEGATIVE
Ketones, ur: NEGATIVE mg/dL
Nitrite: NEGATIVE
Protein, ur: NEGATIVE mg/dL
Specific Gravity, Urine: 1.013 (ref 1.005–1.030)
pH: 5 (ref 5.0–8.0)

## 2020-06-26 MED ORDER — CLOPIDOGREL BISULFATE 75 MG PO TABS
75.0000 mg | ORAL_TABLET | Freq: Every day | ORAL | Status: DC
  Administered 2020-06-26: 75 mg via ORAL
  Filled 2020-06-26: qty 1

## 2020-06-26 MED ORDER — DIPHENHYDRAMINE HCL 25 MG PO CAPS
25.0000 mg | ORAL_CAPSULE | Freq: Once | ORAL | Status: AC
  Administered 2020-06-26: 25 mg via ORAL
  Filled 2020-06-26: qty 1

## 2020-06-26 MED ORDER — ONDANSETRON 4 MG PO TBDP
4.0000 mg | ORAL_TABLET | Freq: Once | ORAL | Status: AC
  Administered 2020-06-26: 4 mg via ORAL
  Filled 2020-06-26: qty 1

## 2020-06-26 NOTE — Discharge Instructions (Addendum)
If any point you change your mind and would like to return to the ER or if you develop severe depression, suicidal thoughts, or any other new/concerning symptoms then return to the ER for evaluation.

## 2020-06-26 NOTE — ED Notes (Signed)
ED Provider at bedside, EDP Goldston on phone with daughter at this time.

## 2020-06-26 NOTE — ED Notes (Signed)
LCSW Riquita notified of need for transportation to [facility].

## 2020-06-26 NOTE — ED Notes (Signed)
After calling report to [facility], pt expressed concerns about means of transportation to facility.  Pt expressed desire to leave and go home.  EDP Tegeler made aware.  After speaking with MD, pt again agreeable to be transported to [place].

## 2020-06-26 NOTE — ED Notes (Signed)
Per Jeannie Done, daughter is coming to pick up pt.

## 2020-06-26 NOTE — ED Notes (Signed)
ED Provider at bedside. 

## 2020-06-26 NOTE — ED Notes (Signed)
Daughter at bedside demanding to speak with provider.  Daughter reporting that pt does not want to go to [place], that she is here to take her home.  EDP Goldston made aware.

## 2020-06-26 NOTE — ED Notes (Signed)
AC notified of inability to contact safe transport.  AC recommends to contact SW.

## 2020-06-26 NOTE — BH Assessment (Signed)
BHH Assessment Progress Note  Per Nira Conn, NP, this pt requires psychiatric hospitalization at this time.  At 10:40 [facility] calls from [facility].  Pt has been accepted to their facility by Dr Loann Quill to the [facility], Rm 153-2.  Caryn Bee, DNP and EDP Lynden Oxford, MD, concur with this disposition, as does the pt who is currently under voluntary status.  Pt's nurse, Carolynne, has been notified, and agrees to call report to [phone].  Pt is to be transported via [employer].  [facility] stipulates that pt must arrive either before 23:00 tonight, or after 06:00 tomorrow.    Doylene Canning, [place] Behavioral Health Coordinator [phone]

## 2020-06-26 NOTE — Progress Notes (Signed)
TOC CM spoke to pt's dtr, Tiffany. States pt will be going home with her tonight. States they will follow up with pt's psych MD on Monday. TOC CM   Isidoro Donning RN CCM, [facility] ED TOC CM [phone]

## 2020-06-26 NOTE — ED Notes (Signed)
RN attempted to contact day time safe transport to transport pt to [facility].  No answer at listed phone number. RN called nighttime/weekend safe transport number.  Operator informed this RN that RN was required to notify day time dispatch of transportation needs and to keep trying number until someone answers. Will continue to call.

## 2020-06-26 NOTE — ED Notes (Signed)
Nira Conn, NP, patient meets inpatient criteria, geropsych recommended. SW to seek placement.

## 2020-06-26 NOTE — ED Notes (Signed)
Patient belongings at nursing station 16-22.

## 2020-06-26 NOTE — ED Notes (Signed)
Pt provided verbal consent to provide update to pts daughter, Elmarie Shiley.

## 2020-06-26 NOTE — ED Notes (Signed)
SW contacted by  Consulting civil engineer.  SW instructed RN to put in consult for transportation and they would arrange it.

## 2020-06-26 NOTE — ED Notes (Signed)
Pt asking where hospital she is going to is located.  Pt informed that [facility] is in [place].  Pt immediately became agitated, began demanding to leave, reporting "I'm not going there. Lavenia Atlas been to the hospital is [place] like that...... and I'm not like that, I'm not going there."  EDP Goldston made aware.

## 2020-06-26 NOTE — ED Provider Notes (Signed)
Patient is wanting to leave. I have discussed with psychiatry, Starkes-Perry NP, who is ok with her leaving AMA. The patient had endorsed some passive SI but never wanting to actually hurt her self and states she would not do that.  The daughter is demanding to take the patient home.  While I feel like her mental health would be better served by being admitted, the daughter is willing to take responsibility for the patient and take her home in her care.  We discussed that she may return and is encouraged to do so at any time.  Follow-up with psychiatry as soon as possible, calling on Monday, 8/30.   Pricilla Loveless, MD 06/26/20 [phone]

## 2020-06-26 NOTE — ED Provider Notes (Signed)
9:56 AM Social work requested patient have a chest x-ray, EKG, urinalysis for [facility] psych placement.  Chest x-ray shows no pneumonia and EKG per similar to prior with no STEMI or significant arrhythmia.  Urinalysis shows some leukocytes and bacteria but there is no nitrites.  Given lack of urinary symptoms, doubt UTI but a culture will be sent for her.  Patient remains medically clear waiting for placement.   Jered Heiny, Canary Brim, MD 06/26/20 [phone]

## 2020-06-27 LAB — URINE CULTURE: Culture: <10000 — AB

## 2021-04-26 ENCOUNTER — Other Ambulatory Visit: Payer: Self-pay

## 2021-04-26 ENCOUNTER — Encounter: Payer: Self-pay | Admitting: Family Medicine

## 2021-04-26 ENCOUNTER — Ambulatory Visit (INDEPENDENT_AMBULATORY_CARE_PROVIDER_SITE_OTHER): Payer: Medicare Other | Admitting: Family Medicine

## 2021-04-26 VITALS — BP 116/74 | HR 84 | Temp 97.5°F | Resp 16 | Ht 61.0 in | Wt 190.6 lb

## 2021-04-26 DIAGNOSIS — J302 Other seasonal allergic rhinitis: Secondary | ICD-10-CM | POA: Diagnosis not present

## 2021-04-26 DIAGNOSIS — J4540 Moderate persistent asthma, uncomplicated: Secondary | ICD-10-CM

## 2021-04-26 DIAGNOSIS — T7800XD Anaphylactic reaction due to unspecified food, subsequent encounter: Secondary | ICD-10-CM

## 2021-04-26 DIAGNOSIS — Z79899 Other long term (current) drug therapy: Secondary | ICD-10-CM

## 2021-04-26 DIAGNOSIS — J3089 Other allergic rhinitis: Secondary | ICD-10-CM | POA: Diagnosis not present

## 2021-04-26 NOTE — Patient Instructions (Signed)
Asthma Continue montelukast 10 mg-take 1 tablet once a day to prevent coughing or wheezing Continue Flovent 110-1 puff twice a day to prevent coughing or wheezing Continue albuterol 2 puffs every 4 hours if needed for wheezing or coughing spells. You may use albuterol 2 puffs 5 to 15 minutes before exercise  Allergic rhinitis  Continue allergen avoidance measures directed toward pollens, dust mite, cat, and cockroach as listed below  Continue Zyrtec 10 mg-take 1 tablet once a day for runny nose or itchy eyes Continue fluticasone 2 sprays per nostril once a day if needed for stuffy nose  Food allergy Continue avoiding shellfish.  If you have an allergic reaction from shellfish or an insect sting, take Benadryl 50 mg every 4 hours and if you have life-threatening symptoms inject with EpiPen 0.3 mg  Continue on  your other medications  Call us if you are not doing well on this treatment plan  Follow up in 6 months or sooner if needed.  Reducing Pollen Exposure The American Academy of All[employer], Asthma and Immunology suggests the following steps to reduce your exposure to pollen during allergy seasons. Do not hang sheets or clothing out to dry; pollen may collect on these items. Do not mow lawns or spend time around freshly cut grass; mowing stirs up pollen. Keep windows closed at night.  Keep car windows closed while driving. Minimize morning activities outdoors, a time when pollen counts are usually at their highest. Stay indoors as much as possible when pollen counts or humidity is high and on windy days when pollen tends to remain in the air longer. Use air conditioning when possible.  Many air conditioners have filters that trap the pollen spores. Use a HEPA room air filter to remove pollen form the indoor air you breathe.  Control of Dog or Cat Allergen Avoidance is the best way to manage a dog or cat allergy. If you have a dog or cat and are allergic to dog or cats, consider removing  the dog or cat from the home. If you have a dog or cat but don't want to find it a new home, or if your family wants a pet even though someone in the household is allergic, here are some strategies that may help keep symptoms at bay:  Keep the pet out of your bedroom and restrict it to only a few rooms. Be advised that keeping the dog or cat in only one room will not limit the allergens to that room. Don't pet, hug or kiss the dog or cat; if you do, wash your hands with soap and water. High-efficiency particulate air (HEPA) cleaners run continuously in a bedroom or living room can reduce allergen levels over time. Regular use of a high-efficiency vacuum cleaner or a central vacuum can reduce allergen levels. Giving your dog or cat a bath at least once a week can reduce airborne allergen.   Control of Dust Mite Allergen Dust mites play a major role in allergic asthma and rhinitis. They occur in environments with high humidity wherever human skin is found. Dust mites absorb humidity from the atmosphere (ie, they do not drink) and feed on organic matter (including shed human and animal skin). Dust mites are a microscopic type of insect that you cannot see with the naked eye. High levels of dust mites have been detected from mattresses, pillows, carpets, upholstered furniture, bed covers, clothes, soft toys and any woven material. The principal allergen of the dust mite is found in its  feces. A gram of dust may contain 1,000 mites and 250,000 fecal particles. Mite antigen is easily measured in the air during house cleaning activities. Dust mites do not bite and do not cause harm to humans, other than by triggering allergies/asthma.  Ways to decrease your exposure to dust mites in your home:  1. Encase mattresses, box springs and pillows with a mite-impermeable barrier or cover  2. Wash sheets, blankets and drapes weekly in hot water (130 F) with detergent and dry them in a dryer on the hot  setting.  3. Have the room cleaned frequently with a vacuum cleaner and a damp dust-mop. For carpeting or rugs, vacuuming with a vacuum cleaner equipped with a high-efficiency particulate air (HEPA) filter. The dust mite allergic individual should not be in a room which is being cleaned and should wait 1 hour after cleaning before going into the room.  4. Do not sleep on upholstered furniture (eg, couches).  5. If possible removing carpeting, upholstered furniture and drapery from the home is ideal. Horizontal blinds should be eliminated in the rooms where the person spends the most time (bedroom, study, television room). Washable vinyl, roller-type shades are optimal.  6. Remove all non-washable stuffed toys from the bedroom. Wash stuffed toys weekly like sheets and blankets above.  7. Reduce indoor humidity to less than 50%. Inexpensive humidity monitors can be purchased at most hardware stores. Do not use a humidifier as can make the problem worse and are not recommended.  Control of Cockroach Allergen Cockroach allergen has been identified as an important cause of acute attacks of asthma, especially in urban settings.  There are fifty-five species of cockroach that exist in the [place], however only three, the [place], [place] species produce allergen that can affect patients with Asthma.  Allergens can be obtained from fecal particles, egg casings and secretions from cockroaches.    Remove food sources. Reduce access to water. Seal access and entry points. Spray runways with 0.5-1% Diazinon or Chlorpyrifos Blow boric acid power under stoves and refrigerator. Place bait stations (hydramethylnon) at feeding sites.

## 2021-04-26 NOTE — Progress Notes (Signed)
[address] [place] [address] Dept: [phone]  FOLLOW UP NOTE  Patient ID: Donna Frank, female    DOB: 03/16/1946  Age: 75 y.o. MRN: [id number] Date of Office Visit: 04/26/2021  Assessment  Chief Complaint: Asthma  HPI Donna Frank is a 75 year old female who presents to the clinic for follow-up visit.  She was last seen in this clinic on 05/13/2019 after [facility] for evaluation of asthma, allergic rhinitis, and food allergy to shellfish.  Note, she reports her asthma has been well controlled with no shortness of breath, cough, or wheeze.  She does however report that she has occasional shortness of breath on hot days or when the pollen count is high she continues montelukast 10 mg once a night, Flovent 110-1 puff every 12 hours and uses albuterol about 2 to 3 days a week.  Allergic rhinitis is reported as moderately well controlled with symptoms including nasal congestion, occasional clear rhinorrhea, and occasional sneeze she spends time outside.  She continues cetirizine liquid as needed and Flonase as needed.  She is not currently using a nasal saline rinse she continues to avoid shellfish with no accidental ingestion or EpiPen use since her last visit to this clinic.  Her current medications are listed in the chart.   Drug Allergies:  Allergies  Allergen Reactions   Naproxen Sodium Nausea And Vomiting and Palpitations    Acid reflux   Shellfish Allergy Itching and Swelling   Codeine    Prednisone Nausea And Vomiting    Only allergic to pink pills. Can take injection ok.    Sulfa Antibiotics    Levofloxacin Itching and Other (See Comments)    Insomnia Insomnia, itching, myalgia     Physical Exam: BP 116/74 (BP Location: Left Arm, Patient Position: Sitting, Cuff Size: Normal)   Pulse 84   Temp (!) 97.5 F (36.4 C) (Temporal)   Resp 16   Ht 5\' 1"  (1.549 m)   Wt 190 lb 9.6 oz (86.5 kg)   SpO2 97%   BMI 36.01 kg/m    Physical Exam Vitals reviewed.   Constitutional:      Appearance: Normal appearance.  HENT:     Head: Normocephalic and atraumatic.     Right Ear: Tympanic membrane normal.     Left Ear: Tympanic membrane normal.     Nose:     Comments: Bilateral nares slightly erythematous with clear nasal drainage noted.  Pharynx normal.  Ears normal.  Eyes normal.    Mouth/Throat:     Pharynx: Oropharynx is clear.  Eyes:     Conjunctiva/sclera: Conjunctivae normal.  Cardiovascular:     Rate and Rhythm: Normal rate and regular rhythm.     Heart sounds: Normal heart sounds. No murmur heard. Pulmonary:     Effort: Pulmonary effort is normal.     Breath sounds: Normal breath sounds.     Comments: Lungs clear to auscultation Musculoskeletal:        General: Normal range of motion.     Cervical back: Normal range of motion and neck supple.  Skin:    General: Skin is warm and dry.  Neurological:     Mental Status: She is alert and oriented to person, place, and time.  Psychiatric:        Mood and Affect: Mood normal.        Behavior: Behavior normal.        Thought Content: Thought content normal.        Judgment: Judgment  normal.    Diagnostics: FVC 1.59, FEV1 1.22.  Predicted FVC 1.98, predicted FEV1 1.52.  Spirometry indicates normal ventilatory function.  Assessment and Plan: 1. Moderate persistent asthma without complication   2. Anaphylactic shock due to food, subsequent encounter   3. Seasonal and perennial allergic rhinitis   4. Current use of beta blocker     Meds ordered this encounter  Medications   montelukast (SINGULAIR) 10 MG tablet    Sig: Take 1 tablet (10 mg total) by mouth at bedtime. To prevent coughing or wheezing.    Dispense:  90 tablet    Refill:  1   EPINEPHrine (EPIPEN 2-PAK) 0.3 mg/0.3 mL IJ SOAJ injection    Sig: Use for severe allergic reactions    Dispense:  2 each    Refill:  1    Give patient one two pak   cetirizine HCl (ZYRTEC) 5 MG/5ML SOLN    Sig: Take 10 mLs (10 mg total) by  mouth daily.    Dispense:  473 mL    Refill:  5     Patient Instructions   Asthma Continue montelukast 10 mg-take 1 tablet once a day to prevent coughing or wheezing Continue Flovent 110-1 puff twice a day to prevent coughing or wheezing Continue albuterol 2 puffs every 4 hours if needed for wheezing or coughing spells. You may use albuterol 2 puffs 5 to 15 minutes before exercise  Allergic rhinitis  Continue allergen avoidance measures directed toward pollens, dust mite, cat, and cockroach as listed below  Continue Zyrtec 10 mg-take 1 tablet once a day for runny nose or itchy eyes Continue fluticasone 2 sprays per nostril once a day if needed for stuffy nose  Food allergy Continue avoiding shellfish.  If you have an allergic reaction from shellfish or an insect sting, take Benadryl 50 mg every 4 hours and if you have life-threatening symptoms inject with EpiPen 0.3 mg  Continue on  your other medications  Call us if you are not doing well on this treatment plan  Follow up in 6 months or sooner if needed.  Reducing Pollen Exposure The American Academy of Allergy, Asthma and Immunology suggests the following steps to reduce your exposure to pollen during allergy seasons. Do not hang sheets or clothing out to dry; pollen may collect on these items. Do not mow lawns or spend time around freshly cut grass; mowing stirs up pollen. Keep windows closed at night.  Keep car windows closed while driving. Minimize morning activities outdoors, a time when pollen counts are usually at their highest. Stay indoors as much as possible when pollen counts or humidity is high and on windy days when pollen tends to remain in the air longer. Use air conditioning when possible.  Many air conditioners have filters that trap the pollen spores. Use a HEPA room air filter to remove pollen form the indoor air you breathe.  Control of Dog or Cat Allergen Avoidance is the best way to manage a dog or cat  allergy. If you have a dog or cat and are allergic to dog or cats, consider removing the dog or cat from the home. If you have a dog or cat but don't want to find it a new home, or if your family wants a pet even though someone in the household is allergic, here are some strategies that may help keep symptoms at bay:  Keep the pet out of your bedroom and restrict it to only a few rooms. Be  advised that keeping the dog or cat in only one room will not limit the allergens to that room. Don't pet, hug or kiss the dog or cat; if you do, wash your hands with soap and water. High-efficiency particulate air (HEPA) cleaners run continuously in a bedroom or living room can reduce allergen levels over time. Regular use of a high-efficiency vacuum cleaner or a central vacuum can reduce allergen levels. Giving your dog or cat a bath at least once a week can reduce airborne allergen.   Control of Dust Mite Allergen Dust mites play a major role in allergic asthma and rhinitis. They occur in environments with high humidity wherever human skin is found. Dust mites absorb humidity from the atmosphere (ie, they do not drink) and feed on organic matter (including shed human and animal skin). Dust mites are a microscopic type of insect that you cannot see with the naked eye. High levels of dust mites have been detected from mattresses, pillows, carpets, upholstered furniture, bed covers, clothes, soft toys and any woven material. The principal allergen of the dust mite is found in its feces. A gram of dust may contain 1,000 mites and 250,000 fecal particles. Mite antigen is easily measured in the air during house cleaning activities. Dust mites do not bite and do not cause harm to humans, other than by triggering allergies/asthma.  Ways to decrease your exposure to dust mites in your home:  1. Encase mattresses, box springs and pillows with a mite-impermeable barrier or cover  2. Wash sheets, blankets and drapes weekly  in hot water (130 F) with detergent and dry them in a dryer on the hot setting.  3. Have the room cleaned frequently with a vacuum cleaner and a damp dust-mop. For carpeting or rugs, vacuuming with a vacuum cleaner equipped with a high-efficiency particulate air (HEPA) filter. The dust mite allergic individual should not be in a room which is being cleaned and should wait 1 hour after cleaning before going into the room.  4. Do not sleep on upholstered furniture (eg, couches).  5. If possible removing carpeting, upholstered furniture and drapery from the home is ideal. Horizontal blinds should be eliminated in the rooms where the person spends the most time (bedroom, study, television room). Washable vinyl, roller-type shades are optimal.  6. Remove all non-washable stuffed toys from the bedroom. Wash stuffed toys weekly like sheets and blankets above.  7. Reduce indoor humidity to less than 50%. Inexpensive humidity monitors can be purchased at most hardware stores. Do not use a humidifier as can make the problem worse and are not recommended.  Control of Cockroach Allergen Cockroach allergen has been identified as an important cause of acute attacks of asthma, especially in urban settings.  There are fifty-five species of cockroach that exist in the [place], however only three, the [place], [place] species produce allergen that can affect patients with Asthma.  Allergens can be obtained from fecal particles, egg casings and secretions from cockroaches.    Remove food sources. Reduce access to water. Seal access and entry points. Spray runways with 0.5-1% Diazinon or Chlorpyrifos Blow boric acid power under stoves and refrigerator. Place bait stations (hydramethylnon) at feeding sites.   No follow-ups on file.    Thank you for the opportunity to care for this patient.  Please do not hesitate to contact me with questions.  Thermon Leyland, FNP [facility]

## 2021-04-27 ENCOUNTER — Encounter: Payer: Self-pay | Admitting: Family Medicine

## 2021-04-27 DIAGNOSIS — Z79899 Other long term (current) drug therapy: Secondary | ICD-10-CM | POA: Insufficient documentation

## 2021-04-27 DIAGNOSIS — J4540 Moderate persistent asthma, uncomplicated: Secondary | ICD-10-CM | POA: Insufficient documentation

## 2021-04-27 MED ORDER — MONTELUKAST SODIUM 10 MG PO TABS: 10.0000 mg | ORAL_TABLET | Freq: Every day | ORAL | 1 refills | Status: DC

## 2021-04-27 MED ORDER — CETIRIZINE HCL 5 MG/5ML PO SOLN: 10.0000 mg | Freq: Every day | ORAL | 5 refills | Status: AC

## 2021-04-27 MED ORDER — EPINEPHRINE 0.3 MG/0.3ML IJ SOAJ: INTRAMUSCULAR | 1 refills | Status: DC

## 2021-05-13 ENCOUNTER — Emergency Department (HOSPITAL_BASED_OUTPATIENT_CLINIC_OR_DEPARTMENT_OTHER)
Admission: EM | Admit: 2021-05-13 | Discharge: 2021-05-13 | Disposition: A | Discharge status: Home / Self Care | Payer: Medicare Other | Attending: Emergency Medicine | Admitting: Emergency Medicine

## 2021-05-13 ENCOUNTER — Encounter (HOSPITAL_BASED_OUTPATIENT_CLINIC_OR_DEPARTMENT_OTHER): Payer: Self-pay

## 2021-05-13 ENCOUNTER — Emergency Department (HOSPITAL_BASED_OUTPATIENT_CLINIC_OR_DEPARTMENT_OTHER): Payer: Medicare Other

## 2021-05-13 ENCOUNTER — Other Ambulatory Visit: Payer: Self-pay

## 2021-05-13 DIAGNOSIS — R0789 Other chest pain: Secondary | ICD-10-CM | POA: Diagnosis not present

## 2021-05-13 DIAGNOSIS — Z79899 Other long term (current) drug therapy: Secondary | ICD-10-CM | POA: Diagnosis not present

## 2021-05-13 DIAGNOSIS — Z7951 Long term (current) use of inhaled steroids: Secondary | ICD-10-CM | POA: Diagnosis not present

## 2021-05-13 DIAGNOSIS — M542 Cervicalgia: Secondary | ICD-10-CM | POA: Diagnosis not present

## 2021-05-13 DIAGNOSIS — Z7902 Long term (current) use of antithrombotics/antiplatelets: Secondary | ICD-10-CM | POA: Diagnosis not present

## 2021-05-13 DIAGNOSIS — R1013 Epigastric pain: Secondary | ICD-10-CM | POA: Insufficient documentation

## 2021-05-13 DIAGNOSIS — E785 Hyperlipidemia, unspecified: Secondary | ICD-10-CM | POA: Diagnosis not present

## 2021-05-13 DIAGNOSIS — E1140 Type 2 diabetes mellitus with diabetic neuropathy, unspecified: Secondary | ICD-10-CM | POA: Insufficient documentation

## 2021-05-13 DIAGNOSIS — I10 Essential (primary) hypertension: Secondary | ICD-10-CM | POA: Insufficient documentation

## 2021-05-13 DIAGNOSIS — E1169 Type 2 diabetes mellitus with other specified complication: Secondary | ICD-10-CM | POA: Diagnosis not present

## 2021-05-13 DIAGNOSIS — Z87891 Personal history of nicotine dependence: Secondary | ICD-10-CM | POA: Diagnosis not present

## 2021-05-13 DIAGNOSIS — J45909 Unspecified asthma, uncomplicated: Secondary | ICD-10-CM | POA: Diagnosis not present

## 2021-05-13 DIAGNOSIS — R519 Headache, unspecified: Secondary | ICD-10-CM

## 2021-05-13 LAB — CBC
HCT: 37.7 % (ref 36.0–46.0)
Hemoglobin: 11.9 g/dL — ABNORMAL LOW (ref 12.0–15.0)
MCH: 26 pg (ref 26.0–34.0)
MCHC: 31.6 g/dL (ref 30.0–36.0)
MCV: 82.5 fL (ref 80.0–100.0)
Platelets: 359 10*3/uL (ref 150–400)
RBC: 4.57 MIL/uL (ref 3.87–5.11)
RDW: 16.9 % — ABNORMAL HIGH (ref 11.5–15.5)
WBC: 5.5 10*3/uL (ref 4.0–10.5)
nRBC: 0 % (ref 0.0–0.2)

## 2021-05-13 LAB — BASIC METABOLIC PANEL
Anion gap: 5 (ref 5–15)
BUN: 20 mg/dL (ref 8–23)
CO2: 29 mmol/L (ref 22–32)
Calcium: 8.7 mg/dL — ABNORMAL LOW (ref 8.9–10.3)
Chloride: 107 mmol/L (ref 98–111)
Creatinine, Ser: 0.88 mg/dL (ref 0.44–1.00)
GFR, Estimated: >60 mL/min (ref >60)
Glucose, Bld: 100 mg/dL — ABNORMAL HIGH (ref 70–99)
Potassium: 3.9 mmol/L (ref 3.5–5.1)
Sodium: 141 mmol/L (ref 135–145)

## 2021-05-13 LAB — TROPONIN I (HIGH SENSITIVITY)
Troponin I (High Sensitivity): 4 ng/L (ref <18)
Troponin I (High Sensitivity): 5 ng/L (ref <18)

## 2021-05-13 MED ORDER — FAMOTIDINE 20 MG PO TABS
40.0000 mg | ORAL_TABLET | Freq: Once | ORAL | Status: AC
  Administered 2021-05-13: 40 mg via ORAL
  Filled 2021-05-13: qty 2

## 2021-05-13 MED ORDER — ALUM & MAG HYDROXIDE-SIMETH 200-200-20 MG/5ML PO SUSP
30.0000 mL | Freq: Once | ORAL | Status: AC
  Administered 2021-05-13: 30 mL via ORAL
  Filled 2021-05-13: qty 30

## 2021-05-13 MED ORDER — LIDOCAINE VISCOUS HCL 2 % MT SOLN
15.0000 mL | Freq: Once | OROMUCOSAL | Status: AC
  Administered 2021-05-13: 15 mL via ORAL
  Filled 2021-05-13: qty 15

## 2021-05-13 MED ORDER — DEXAMETHASONE SODIUM PHOSPHATE 10 MG/ML IJ SOLN
10.0000 mg | Freq: Once | INTRAMUSCULAR | Status: AC
  Administered 2021-05-13: 10 mg via INTRAVENOUS
  Filled 2021-05-13: qty 1

## 2021-05-13 NOTE — Discharge Instructions (Addendum)
1.  Continue all of your medications for reflux.  Take them daily. 2.  You have been given a dose of Decadron to help with your headache.  This is a steroid shot.  Continue to monitor your blood pressure in your blood sugars at home.  See your doctor for recheck within 3 to 5 days. 3.  Return to the emergency department if your headache is worsening, you develop new or concerning symptoms. 4.  Today your heart labs are normal.  There is not an indication that you have had a heart attack.  Your blood pressure and heart rate are normal.  Continue all of your regularly prescribed medications.

## 2021-05-13 NOTE — ED Notes (Signed)
Patient transported to X-ray 

## 2021-05-13 NOTE — ED Provider Notes (Signed)
[facility] EMERGENCY DEPARTMENT Provider Note   CSN: [id number] Arrival date & time: 05/13/21  1613     History Chief Complaint  Patient presents with   Chest Pain   Headache    Donna Frank is a 75 y.o. female.  HPI   She reports he had a headache off and on for a number of days.  She reports is not unusual for her to get fairly frequent headaches.   Generally is assuming that her headache comes from her neck problems.  She reports that she has a lot of arthritis and muscle spasm problems in her neck.  She reports Thing that usually works is steroid shot and her muscle relaxers.  She reports she took Flexeril yesterday.  She reports it helped somewhat.  She is now out of it.  She reports headache is typical of headache she has experienced previously.  She does see the neurologist.  She reports she has never had a history of a stroke.  No strokelike symptoms associated with this.  No confusion no incoordination no focal weakness numbness or tingling.  Headache is not sudden.  No fevers or chills.  Patient where he also developed some chest pain.  She indicates epigastrium.  She reports she does have pretty bad problems with reflux.  She reports that she did eat spaghetti last night and had tried to take some Nexium yesterday but ended up feeling really uncomfortable earlier this morning in her upper abdomen lower chest area.  She reports she did not take any more reflux medications this morning or today.  Symptoms have been waxing and waning somewhat.  No associated shortness of breath, near syncope or radiation.  Past Medical History:  Diagnosis Date   Asthma    CVA (cerebral vascular accident) (HCC)    Diabetes mellitus without complication (HCC)    Hiatal hernia    Hyperlipidemia    Hypertension    Pinched nerve in neck    neck and shoulder   Pre-diabetes    Reflux    Stroke [facility])    Vertigo     Patient Active Problem List   Diagnosis Date Noted   Moderate  persistent asthma without complication 04/27/2021   Current use of beta blocker 04/27/2021   Mild persistent asthma without complication 12/03/2018   Seasonal and perennial allergic rhinitis 12/03/2018   Anaphylactic shock due to adverse food reaction 12/03/2018   Gastroesophageal reflux disease without esophagitis 12/03/2018   Essential hypertension 12/03/2018   Prediabetes 12/03/2018   History of glaucoma 12/03/2018   Allergic reaction to insect sting 12/03/2018   Diabetic neuropathy, type II diabetes mellitus (HCC) 01/27/2015   Extrinsic asthma 09/11/2014   Hammer toe, acquired 11/27/2013   Unspecified hereditary and idiopathic peripheral neuropathy 07/23/2013   Bunion of great toe 07/23/2013    Past Surgical History:  Procedure Laterality Date   ABDOMINAL HYSTERECTOMY     partial   CERVICAL DISC SURGERY  2017   KNEE SURGERY  2019   replacement     OB History   No obstetric history on file.     Family History  Problem Relation Age of Onset   Emphysema Maternal Grandfather    COPD Mother    Heart disease Maternal Uncle    Heart disease Maternal Aunt    Rheum arthritis Sister    Asthma Granddaughter    Allergic rhinitis Granddaughter    Asthma Grandson    Allergic rhinitis Grandson    Lupus Niece  Immunodeficiency Neg Hx    Angioedema Neg Hx    Atopy Neg Hx    Urticaria Neg Hx     Social History   Tobacco Use   Smoking status: Former    Packs/day: 0.50    Years: 35.00    Pack years: 17.50    Types: Cigarettes    Quit date: 12/21/2008    Years since quitting: 12.4   Smokeless tobacco: Never  Vaping Use   Vaping Use: Never used  Substance Use Topics   Alcohol use: No    Alcohol/week: 0.0 standard drinks   Drug use: No    Home Medications Prior to Admission medications   Medication Sig Start Date End Date Taking? Authorizing Provider  acetaminophen (TYLENOL) 500 MG tablet Take by mouth.    [provider]  albuterol (PROAIR HFA) 108 (90  Base) MCG/ACT inhaler Inhale 2 puffs into the lungs every 6 (six) hours as needed for wheezing or shortness of breath. 05/13/19   Fletcher Anon, MD  albuterol (PROVENTIL) (2.5 MG/3ML) 0.083% nebulizer solution Take 2.5 mg by nebulization every 6 (six) hours as needed for wheezing or shortness of breath.    [provider]  atenolol (TENORMIN) 25 MG tablet Take 25 mg by mouth daily. 03/11/21   [provider]  cetirizine HCl (ZYRTEC) 5 MG/5ML SOLN Take 10 mLs (10 mg total) by mouth daily. 04/27/21   Hetty Blend, FNP  cholecalciferol (VITAMIN D) 1000 UNITS tablet Take 2,000 Units by mouth daily.     [provider]  Cholecalciferol 50 MCG (2000 UT) CAPS Take 1 capsule by mouth daily.    [provider]  clobetasol (TEMOVATE) 0.05 % external solution 1(ONE) APPLICATION(S) SCALP 2(TWO) TIMES A DAY 11/14/18   [provider]  clopidogrel (PLAVIX) 75 MG tablet Take 75 mg by mouth daily. 05/06/20   [provider]  co-enzyme Q-10 30 MG capsule Take 30 mg by mouth in the morning, at noon, and at bedtime.    [provider]  cyanocobalamin 100 MCG tablet Take 100 mcg by mouth daily. 05/16/19   [provider]  cyclobenzaprine (FLEXERIL) 10 MG tablet Take 10 mg by mouth 3 (three) times daily as needed. 03/31/21   [provider]  diazepam (VALIUM) 2 MG tablet TAKE 1 TABLET BY MOUTH 2 TIMES DAILY AS NEEDED FOR ANXIETY. 06/16/17   [provider]  diclofenac Sodium (VOLTAREN) 1 % GEL Apply 1 application topically 4 (four) times daily as needed. Apply quarter sized amount to neck and back of head three to four times per day as needed 02/20/20   [provider]  DULoxetine (CYMBALTA) 60 MG capsule Take 60 mg by mouth daily.    [provider]  EPINEPHrine (EPIPEN 2-PAK) 0.3 mg/0.3 mL IJ SOAJ injection Use for severe allergic reactions 04/27/21   Ambs, Norvel Richards, FNP  Esomeprazole Magnesium (NEXIUM PO) Take by mouth.     [provider]  fluticasone (FLOVENT HFA) 44 MCG/ACT inhaler Inhale into the lungs. 09/17/20   [provider]  HYDROcodone-acetaminophen (NORCO/VICODIN) 5-325 MG tablet Take 1 tablet by mouth every 6 (six) hours as needed. 12/07/20   [provider]  latanoprost (XALATAN) 0.005 % ophthalmic solution Place 1 drop into both eyes at bedtime. 08/27/14   [provider]  lidocaine (LIDODERM) 5 % Place 1 patch onto the skin daily. 05/23/20   [provider]  lisinopril (PRINIVIL,ZESTRIL) 10 MG tablet Take 40 mg by mouth daily.  [provider]  meclizine (ANTIVERT) 12.5 MG tablet Take 1 tablet by mouth 3 (three) times daily as needed. 05/28/20   [provider]  montelukast (SINGULAIR) 10 MG tablet Take 1 tablet (10 mg total) by mouth at bedtime. To prevent coughing or wheezing. 04/27/21   Hetty Blend, FNP  pravastatin (PRAVACHOL) 80 MG tablet Take 1 tablet by mouth daily. 11/09/20   [provider]    Allergies    Naproxen sodium, Shellfish allergy, Codeine, Prednisone, Sulfa antibiotics, and Levofloxacin  Review of Systems   Review of Systems 10 systems reviewed and negative except as per HPI Physical Exam Updated Vital Signs BP 129/66   Pulse 71   Temp 98.7 F (37.1 C) (Oral)   Resp 18   Ht 5\' 1"  (1.549 m)   Wt 86.6 kg   SpO2 99%   BMI 36.09 kg/m   Physical Exam Constitutional:      Appearance: Normal appearance.  HENT:     Head: Normocephalic and atraumatic.     Nose: Nose normal.     Mouth/Throat:     Mouth: Mucous membranes are moist.     Pharynx: Oropharynx is clear.  Eyes:     Extraocular Movements: Extraocular movements intact.     Pupils: Pupils are equal, round, and reactive to light.  Cardiovascular:     Rate and Rhythm: Normal rate and regular rhythm.  Pulmonary:     Effort: Pulmonary effort is normal.     Breath sounds: Normal breath sounds.  Abdominal:     General: There is no distension.      Palpations: Abdomen is soft.     Tenderness: There is no abdominal tenderness. There is no guarding.  Musculoskeletal:        General: No swelling or tenderness. Normal range of motion.     Cervical back: Neck supple.     Right lower leg: No edema.     Left lower leg: No edema.  Skin:    General: Skin is warm and dry.  Neurological:     General: No focal deficit present.     Mental Status: She is alert and oriented to person, place, and time.     Motor: No weakness.     Coordination: Coordination normal.  Psychiatric:        Mood and Affect: Mood normal.    ED Results / Procedures / Treatments   Labs (all labs ordered are listed, but only abnormal results are displayed) Labs Reviewed  BASIC METABOLIC PANEL - Abnormal; Notable for the following components:      Result Value   Glucose, Bld 100 (*)    Calcium 8.7 (*)    All other components within normal limits  CBC - Abnormal; Notable for the following components:   Hemoglobin 11.9 (*)    RDW 16.9 (*)    All other components within normal limits  TROPONIN I (HIGH SENSITIVITY)  TROPONIN I (HIGH SENSITIVITY)    EKG EKG Interpretation  Date/Time:  Thursday May 13 2021 16:26:35 EDT Ventricular Rate:  64 PR Interval:  154 QRS Duration: 80 QT Interval:  406 QTC Calculation: 418 R Axis:   80 Text Interpretation: Normal sinus rhythm with sinus arrhythmia no acute ischemic appearance. no sig change from previous Confirmed by [id number] [phone]) on 05/13/2021 7:25:07 PM  Radiology DG Chest 2 View  Result Date: 05/13/2021 CLINICAL DATA:  Chest pain. Additional history provided by scanning technologist: Patient reports shooting pain in back of  head and dizziness, symptoms for 1 week, chest pain which began today. EXAM: CHEST - 2 VIEW COMPARISON:  Prior chest radiographs 09/03/2020 and earlier. FINDINGS: Heart size within normal limits. Aortic atherosclerosis. No appreciable airspace consolidation. No evidence of pleural  effusion or pneumothorax. No acute bony abnormality. Redemonstrated cervicothoracic ventral fusion hardware. Levocurvature at the thoracolumbar junction. IMPRESSION: No evidence of acute cardiopulmonary abnormality. Aortic Atherosclerosis (ICD10-I70.0). Electronically Signed   By: Jackey LogeKyle  Golden DO   On: 05/13/2021 17:18   CT Head Wo Contrast  Result Date: 05/13/2021 CLINICAL DATA:  Dizziness, nonspecific. Additional provided: Patient reports shooting pain to back of head and dizziness for 1 week. EXAM: CT HEAD WITHOUT CONTRAST TECHNIQUE: Contiguous axial images were obtained from the base of the skull through the vertex without intravenous contrast. COMPARISON:  Brain MRI 05/14/2020. FINDINGS: Brain: Cerebral volume is unremarkable for age. Redemonstrated small chronic cortical/subcortical infarcts within the mid right frontal lobe/frontal operculum. Mild patchy and ill-defined hypoattenuation within the cerebral white matter elsewhere is nonspecific, but compatible with chronic small vessel ischemic disease. Known small chronic cerebellar infarcts, better appreciated on the brain MRI of 05/14/2020. There is no acute intracranial hemorrhage. No acute demarcated cortical infarct. No extra-axial fluid collection. No evidence of an intracranial mass. No midline shift. Partially empty sella turcica. Vascular: No hyperdense vessel.  Atherosclerotic calcifications Skull: Normal. Negative for fracture or focal lesion. Sinuses/Orbits: Visualized orbits show no acute finding. Trace bilateral ethmoid sinus mucosal thickening. Small mucous retention cysts within a posterior left ethmoid air cell and within the left sphenoid sinus. IMPRESSION: No evidence of acute intracranial abnormality. Redemonstrated small chronic cortical/subcortical infarcts within the mid right frontal lobe/frontal operculum. Mild chronic small vessel ischemic changes within the cerebral white matter. Known small chronic cerebellar infarcts. Mild  paranasal sinus disease at the imaged levels, as described. Electronically Signed   By: Jackey LogeKyle  Golden DO   On: 05/13/2021 17:16    Procedures Procedures   Medications Ordered in ED Medications  dexamethasone (DECADRON) injection 10 mg (10 mg Intravenous Given 05/13/21 2003)  alum & mag hydroxide-simeth (MAALOX/MYLANTA) 200-200-20 MG/5ML suspension 30 mL (30 mLs Oral Given 05/13/21 2003)    And  lidocaine (XYLOCAINE) 2 % viscous mouth solution 15 mL (15 mLs Oral Given 05/13/21 2003)  famotidine (PEPCID) tablet 40 mg (40 mg Oral Given 05/13/21 2002)    ED Course  I have reviewed the triage vital signs and the nursing notes.  Pertinent labs & imaging results that were available during my care of the patient were reviewed by me and considered in my medical decision making (see chart for details).    MDM Rules/Calculators/A&P                          EMR reviewed.  Patient does have history of frequent and recurrent headache.  She does have a distant history of stroke.  No strokelike symptoms today.  Patient describes a posterior headache.  She does have prior MRIs and CT angiograms within the last 1 to 2 years.  At this time CT head does not show any acute findings.  Patient does not show signs of having acute infectious illness such as meningitis.  Her mental status is clear.  She describes chronic neck pain with history of undergoing physical therapy and mostly being treated with muscle relaxers.  Patient reports that the most effective treatment that she gets this typically steroids.  She reports her A1c has been good and  she is not on any agents for diabetes at this time.  We will proceed with a dose of Decadron and refill the patient's Flexeril for headache.  Regarding chest pain, this appears to be epigastric pain.  Mostly precipitated this episode by eating spaghetti.  Troponin is negative and EKG is unchanged.  Chest pain history does not sound suspicious for ischemia.  Will recommend continued  compliance with PPIs and diet with close follow-up and return precautions reviewed. Final Clinical Impression(s) / ED Diagnoses Final diagnoses:  Bad headache  Atypical chest pain    Rx / DC Orders ED Discharge Orders     None        Arby Barrette, MD 05/13/21 2051

## 2021-05-13 NOTE — ED Notes (Signed)
Discussed case with EDP-orders for CT head

## 2021-05-13 NOTE — ED Triage Notes (Signed)
Pt c/o shooting pain to back of head and dizziness x 1 week-CP started yesterday-NAD-steady gait

## 2021-06-02 ENCOUNTER — Other Ambulatory Visit: Payer: Self-pay | Admitting: Orthopedic Surgery

## 2021-06-02 DIAGNOSIS — M4312 Spondylolisthesis, cervical region: Secondary | ICD-10-CM

## 2021-06-08 ENCOUNTER — Telehealth: Payer: Self-pay | Admitting: Family Medicine

## 2021-06-08 NOTE — Telephone Encounter (Signed)
Called and spoke with patient concerning spacer. We do have some in office. Forms is signed and ready for patient to pick up.

## 2021-06-08 NOTE — Telephone Encounter (Signed)
Pt states she requested a spacer and  we did not have any available. Pt is asking if there are spacers available now or if we can tell her where to get one.

## 2021-06-13 ENCOUNTER — Ambulatory Visit
Admission: RE | Admit: 2021-06-13 | Discharge: 2021-06-13 | Disposition: A | Discharge status: Home / Self Care | Payer: Medicare Other | Source: Ambulatory Visit | Attending: Orthopedic Surgery | Admitting: Orthopedic Surgery

## 2021-06-13 DIAGNOSIS — M4312 Spondylolisthesis, cervical region: Secondary | ICD-10-CM

## 2021-10-17 ENCOUNTER — Other Ambulatory Visit: Payer: Self-pay | Admitting: Family Medicine

## 2022-01-01 ENCOUNTER — Emergency Department (HOSPITAL_BASED_OUTPATIENT_CLINIC_OR_DEPARTMENT_OTHER)
Admission: EM | Admit: 2022-01-01 | Discharge: 2022-01-01 | Disposition: A | Discharge status: Home / Self Care | Payer: Medicare Other | Attending: Emergency Medicine | Admitting: Emergency Medicine

## 2022-01-01 ENCOUNTER — Other Ambulatory Visit: Payer: Self-pay

## 2022-01-01 ENCOUNTER — Encounter (HOSPITAL_BASED_OUTPATIENT_CLINIC_OR_DEPARTMENT_OTHER): Payer: Self-pay | Admitting: Emergency Medicine

## 2022-01-01 DIAGNOSIS — R519 Headache, unspecified: Secondary | ICD-10-CM | POA: Insufficient documentation

## 2022-01-01 DIAGNOSIS — G8929 Other chronic pain: Secondary | ICD-10-CM

## 2022-01-01 DIAGNOSIS — M542 Cervicalgia: Secondary | ICD-10-CM | POA: Diagnosis not present

## 2022-01-01 DIAGNOSIS — Z7902 Long term (current) use of antithrombotics/antiplatelets: Secondary | ICD-10-CM | POA: Insufficient documentation

## 2022-01-01 LAB — URINALYSIS, MICROSCOPIC (REFLEX)

## 2022-01-01 LAB — URINALYSIS, ROUTINE W REFLEX MICROSCOPIC
Bilirubin Urine: NEGATIVE
Glucose, UA: NEGATIVE mg/dL
Ketones, ur: NEGATIVE mg/dL
Leukocytes,Ua: NEGATIVE
Nitrite: NEGATIVE
Protein, ur: NEGATIVE mg/dL
Specific Gravity, Urine: 1.025 (ref 1.005–1.030)
pH: 6 (ref 5.0–8.0)

## 2022-01-01 LAB — CBC
HCT: 37 % (ref 36.0–46.0)
Hemoglobin: 11.9 g/dL — ABNORMAL LOW (ref 12.0–15.0)
MCH: 26.9 pg (ref 26.0–34.0)
MCHC: 32.2 g/dL (ref 30.0–36.0)
MCV: 83.5 fL (ref 80.0–100.0)
Platelets: 313 10*3/uL (ref 150–400)
RBC: 4.43 MIL/uL (ref 3.87–5.11)
RDW: 18.5 % — ABNORMAL HIGH (ref 11.5–15.5)
WBC: 6.7 10*3/uL (ref 4.0–10.5)
nRBC: 0 % (ref 0.0–0.2)

## 2022-01-01 LAB — BASIC METABOLIC PANEL
Anion gap: 7 (ref 5–15)
BUN: 17 mg/dL (ref 8–23)
CO2: 28 mmol/L (ref 22–32)
Calcium: 8.8 mg/dL — ABNORMAL LOW (ref 8.9–10.3)
Chloride: 105 mmol/L (ref 98–111)
Creatinine, Ser: 0.94 mg/dL (ref 0.44–1.00)
GFR, Estimated: >60 mL/min (ref >60)
Glucose, Bld: 87 mg/dL (ref 70–99)
Potassium: 3.9 mmol/L (ref 3.5–5.1)
Sodium: 140 mmol/L (ref 135–145)

## 2022-01-01 MED ORDER — METHOCARBAMOL 500 MG PO TABS: 500.0000 mg | ORAL_TABLET | Freq: Two times a day (BID) | ORAL | 0 refills | Status: AC

## 2022-01-01 MED ORDER — KETOROLAC TROMETHAMINE 15 MG/ML IJ SOLN
15.0000 mg | Freq: Once | INTRAMUSCULAR | Status: AC
  Administered 2022-01-01: 15 mg via INTRAMUSCULAR
  Filled 2022-01-01: qty 1

## 2022-01-01 MED ORDER — OXYCODONE HCL 5 MG PO TABS
5.0000 mg | ORAL_TABLET | Freq: Once | ORAL | Status: DC
  Filled 2022-01-01: qty 1

## 2022-01-01 MED ORDER — DICLOFENAC SODIUM 1 % EX GEL: 4.0000 g | Freq: Four times a day (QID) | CUTANEOUS | 0 refills | Status: AC

## 2022-01-01 MED ORDER — ACETAMINOPHEN 500 MG PO TABS
1000.0000 mg | ORAL_TABLET | Freq: Once | ORAL | Status: AC
  Administered 2022-01-01: 1000 mg via ORAL
  Filled 2022-01-01: qty 2

## 2022-01-01 NOTE — ED Triage Notes (Signed)
Pt arrives pov, to triage with assistance of cane, c/o right side neck and upper back pain, hx of same x 1 week. Pt also  reports HA and  pressure behind eyes. Pt denies n/v. Pt a/ox4, pt at baseline ?

## 2022-01-01 NOTE — ED Provider Notes (Signed)
[facility] EMERGENCY DEPARTMENT Provider Note   CSN: [id number] Arrival date & time: 01/01/22  1637     History  Chief Complaint  Patient presents with   Headache    Donna Frank is a 76 y.o. female.  76 yo F with a chief complaints of headaches.  She tells me this is been going on for a long time and it is caused by pain in her neck.  She has been seen in the emergency department for this before.  Had a fall about a month ago and thinks that it is because her pain to get a bit worse.  She is already undergoing physical therapy and is on muscle relaxants and Valium.  She denies any recurrent trauma.  Denies one-sided numbness or weakness denies difficulty speech or swallowing.  Denies fevers.   Headache     Home Medications Prior to Admission medications   Medication Sig Start Date End Date Taking? Authorizing Provider  diclofenac Sodium (VOLTAREN) 1 % GEL Apply 4 g topically 4 (four) times daily. 01/01/22  Yes Melene Plan, DO  methocarbamol (ROBAXIN) 500 MG tablet Take 1 tablet (500 mg total) by mouth 2 (two) times daily. 01/01/22  Yes Melene Plan, DO  acetaminophen (TYLENOL) 500 MG tablet Take by mouth.    [provider]  albuterol (PROAIR HFA) 108 (90 Base) MCG/ACT inhaler Inhale 2 puffs into the lungs every 6 (six) hours as needed for wheezing or shortness of breath. 05/13/19   Fletcher Anon, MD  albuterol (PROVENTIL) (2.5 MG/3ML) 0.083% nebulizer solution Take 2.5 mg by nebulization every 6 (six) hours as needed for wheezing or shortness of breath.    [provider]  atenolol (TENORMIN) 25 MG tablet Take 25 mg by mouth daily. 03/11/21   [provider]  cetirizine HCl (ZYRTEC) 5 MG/5ML SOLN Take 10 mLs (10 mg total) by mouth daily. 04/27/21   Hetty Blend, FNP  cholecalciferol (VITAMIN D) 1000 UNITS tablet Take 2,000 Units by mouth daily.     [provider]  Cholecalciferol 50 MCG (2000 UT) CAPS Take 1 capsule by mouth daily.     [provider]  clobetasol (TEMOVATE) 0.05 % external solution 1(ONE) APPLICATION(S) SCALP 2(TWO) TIMES A DAY 11/14/18   [provider]  clopidogrel (PLAVIX) 75 MG tablet Take 75 mg by mouth daily. 05/06/20   [provider]  co-enzyme Q-10 30 MG capsule Take 30 mg by mouth in the morning, at noon, and at bedtime.    [provider]  cyanocobalamin 100 MCG tablet Take 100 mcg by mouth daily. 05/16/19   [provider]  cyclobenzaprine (FLEXERIL) 10 MG tablet Take 10 mg by mouth 3 (three) times daily as needed. 03/31/21   [provider]  diazepam (VALIUM) 2 MG tablet TAKE 1 TABLET BY MOUTH 2 TIMES DAILY AS NEEDED FOR ANXIETY. 06/16/17   [provider]  DULoxetine (CYMBALTA) 60 MG capsule Take 60 mg by mouth daily.    [provider]  EPINEPHrine (EPIPEN 2-PAK) 0.3 mg/0.3 mL IJ SOAJ injection Use for severe allergic reactions 04/27/21   Ambs, Norvel Richards, FNP  Esomeprazole Magnesium (NEXIUM PO) Take by mouth.    [provider]  fluticasone (FLOVENT HFA) 44 MCG/ACT inhaler Inhale into the lungs. 09/17/20   [provider]  HYDROcodone-acetaminophen (NORCO/VICODIN) 5-325 MG tablet Take 1 tablet by mouth every 6 (six) hours as needed. 12/07/20   [provider]  latanoprost (XALATAN) 0.005 % ophthalmic solution  Place 1 drop into both eyes at bedtime. 08/27/14   [provider]  lidocaine (LIDODERM) 5 % Place 1 patch onto the skin daily. 05/23/20   [provider]  lisinopril (PRINIVIL,ZESTRIL) 10 MG tablet Take 40 mg by mouth daily.     [provider]  meclizine (ANTIVERT) 12.5 MG tablet Take 1 tablet by mouth 3 (three) times daily as needed. 05/28/20   [provider]  montelukast (SINGULAIR) 10 MG tablet TAKE 1 TABLET (10 MG TOTAL) BY MOUTH AT BEDTIME. TO PREVENT COUGHING OR WHEEZING. 10/18/21   Ambs, Norvel Richards, FNP  pravastatin (PRAVACHOL) 80 MG tablet Take 1 tablet by mouth daily.  11/09/20   [provider]      Allergies    Naproxen sodium, Shellfish allergy, Codeine, Prednisone, Sulfa antibiotics, and Levofloxacin    Review of Systems   Review of Systems  Neurological:  Positive for headaches.   Physical Exam Updated Vital Signs BP 129/64   Pulse 85   Temp 98.4 F (36.9 C) (Oral)   Resp 16   Ht 5\' 1"  (1.549 m)   Wt 80.7 kg   SpO2 98%   BMI 33.63 kg/m  Physical Exam Vitals and nursing note reviewed.  Constitutional:      General: She is not in acute distress.    Appearance: She is well-developed. She is not diaphoretic.  HENT:     Head: Normocephalic and atraumatic.  Eyes:     Pupils: Pupils are equal, round, and reactive to light.  Cardiovascular:     Rate and Rhythm: Normal rate and regular rhythm.     Heart sounds: No murmur heard.   No friction rub. No gallop.  Pulmonary:     Effort: Pulmonary effort is normal.     Breath sounds: No wheezing or rales.  Abdominal:     General: There is no distension.     Palpations: Abdomen is soft.     Tenderness: There is no abdominal tenderness.  Musculoskeletal:        General: No tenderness.     Cervical back: Normal range of motion and neck supple.     Comments: Trapezius muscles are a bit stiff bilaterally.  No midline tenderness step-offs or deformities.  Able to rotate her head 45 degrees in either direction without significant midline pain.  Skin:    General: Skin is warm and dry.  Neurological:     Mental Status: She is alert and oriented to person, place, and time.  Psychiatric:        Behavior: Behavior normal.    ED Results / Procedures / Treatments   Labs (all labs ordered are listed, but only abnormal results are displayed) Labs Reviewed  BASIC METABOLIC PANEL - Abnormal; Notable for the following components:      Result Value   Calcium 8.8 (*)    All other components within normal limits  CBC - Abnormal; Notable for the following components:   Hemoglobin 11.9 (*)     RDW 18.5 (*)    All other components within normal limits  URINALYSIS, ROUTINE W REFLEX MICROSCOPIC - Abnormal; Notable for the following components:   Hgb urine dipstick TRACE (*)    All other components within normal limits  URINALYSIS, MICROSCOPIC (REFLEX) - Abnormal; Notable for the following components:   Bacteria, UA FEW (*)    All other components within normal limits    EKG None  Radiology No results found.  Procedures Procedures    Medications  Ordered in ED Medications  acetaminophen (TYLENOL) tablet 1,000 mg (has no administration in time range)  oxyCODONE (Oxy IR/ROXICODONE) immediate release tablet 5 mg (has no administration in time range)  ketorolac (TORADOL) 15 MG/ML injection 15 mg (has no administration in time range)    ED Course/ Medical Decision Making/ A&P                           Medical Decision Making Amount and/or Complexity of Data Reviewed Labs: ordered.  Risk OTC drugs. Prescription drug management.   76 yo F with a chief complaints of headaches.  This has been ongoing problem for her.  Has been getting worse she thinks over the past month after she had suffered a fall.  Her neuro exam is unremarkable here.  She had a laboratory evaluation obtained in triage that is unremarkable no significant leukocytosis no significant anemia no significant electrolyte abnormality her UA is independently interpreted by me negative for infection.  I do not feel that imaging would be helpful with the patient having chronic symptoms and no significant change.  We will have her follow-up with her family doctor in the office.  She treat her pain here.  Of note the patient is also traveled here with her daughter who is also being seen as a patient.  7:17 PM:  I have discussed the diagnosis/risks/treatment options with the patient.  Evaluation and diagnostic testing in the emergency department does not suggest an emergent condition requiring admission or immediate  intervention beyond what has been performed at this time.  They will follow up with  PCP. We also discussed returning to the ED immediately if new or worsening sx occur. We discussed the sx which are most concerning (e.g., sudden worsening pain, fever, inability to tolerate by mouth) that necessitate immediate return. Medications administered to the patient during their visit and any new prescriptions provided to the patient are listed below.  Medications given during this visit Medications  acetaminophen (TYLENOL) tablet 1,000 mg (has no administration in time range)  oxyCODONE (Oxy IR/ROXICODONE) immediate release tablet 5 mg (has no administration in time range)  ketorolac (TORADOL) 15 MG/ML injection 15 mg (has no administration in time range)     The patient appears reasonably screen and/or stabilized for discharge and I doubt any other medical condition or other [facility] requiring further screening, evaluation, or treatment in the ED at this time prior to discharge.          Final Clinical Impression(s) / ED Diagnoses Final diagnoses:  Chronic neck pain    Rx / DC Orders ED Discharge Orders          Ordered    methocarbamol (ROBAXIN) 500 MG tablet  2 times daily        01/01/22 1859    diclofenac Sodium (VOLTAREN) 1 % GEL  4 times daily        01/01/22 1859              Melene Plan, DO 01/01/22 1917

## 2022-01-01 NOTE — Discharge Instructions (Addendum)
Use the gel as prescribed. ?Also take tylenol 1000mg (2 extra strength) four times a day.  ? ?You have to be careful with the muscle relaxant as it can make you sleepy or confused and can be get more easy to fall down. ? ?Please follow-up with your family doctor in the office. ?

## 2022-04-14 ENCOUNTER — Other Ambulatory Visit: Payer: Self-pay | Admitting: Family Medicine

## 2022-04-22 ENCOUNTER — Other Ambulatory Visit: Payer: Self-pay | Admitting: Family Medicine

## 2022-06-21 IMAGING — MR MR HEAD W/O CM
12 of 13 series · 44 of 48 positions shown · non-contrast
Comparison: Prior head CT from 01/09/2020.

CLINICAL DATA: Initial evaluation for neuro deficit, stroke
suspected.

EXAM:
MRI HEAD WITHOUT CONTRAST
TECHNIQUE: Multiplanar, multiecho pulse sequences of the brain and surrounding
structures were obtained without intravenous contrast.

[Series 5: DWI · axial · 3.0mm · 0.88mm/px · z∈[-107,+36]mm · 7 of 100 slices shown (1 of 4)]
[im 1/100]
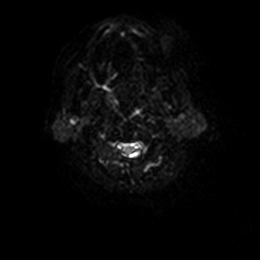
[im 17/100]
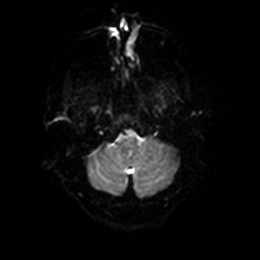
[im 34/100]
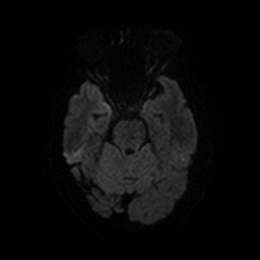
[im 50/100]
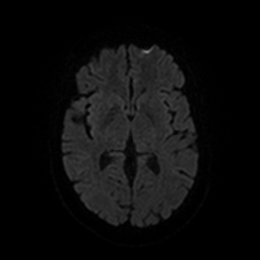
[im 67/100]
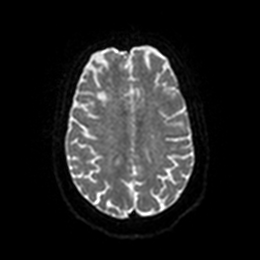
[im 83/100]
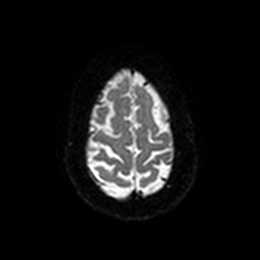
[im 100/100]
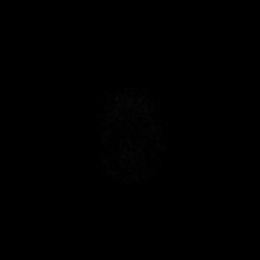

[Series 6: DWI · axial · 3.0mm · 0.88mm/px · z∈[-107,+36]mm · 4 of 50 slices shown (2 of 4)]
[im 1/50]
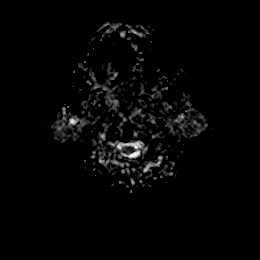
[im 17/50]
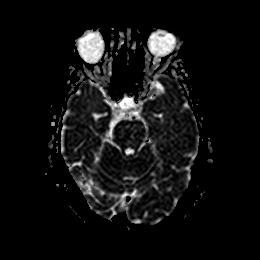
[im 33/50]
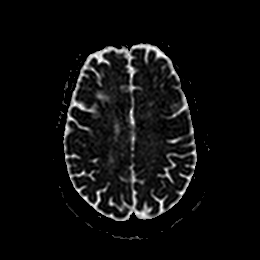
[im 50/50]
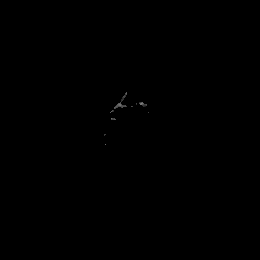

[Series 7: DWI · coronal · 4.0mm · 0.88mm/px · 6 of 72 slices shown (3 of 4)]
[im 1/72]
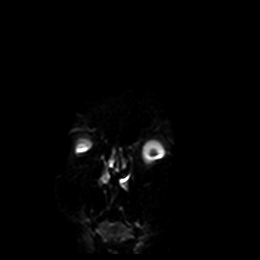
[im 15/72]
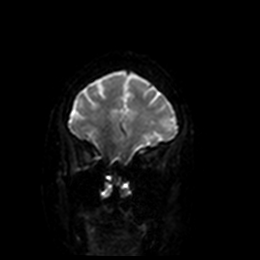
[im 29/72]
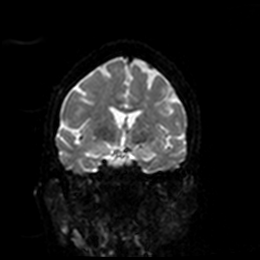
[im 43/72]
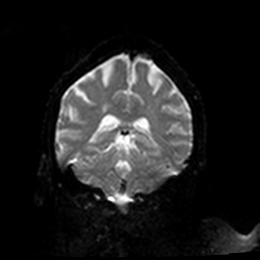
[im 57/72]
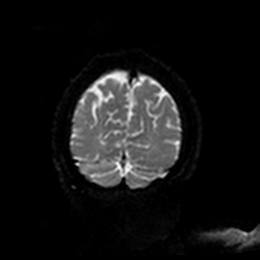
[im 72/72]
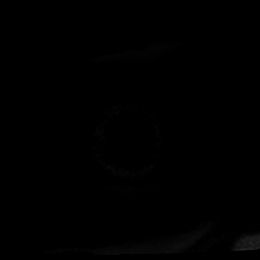

[Series 8: DWI · coronal · 4.0mm · 0.88mm/px · 3 of 36 slices shown (4 of 4)]
[im 1/36]
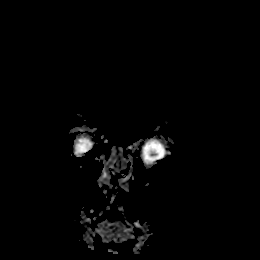
[im 18/36]
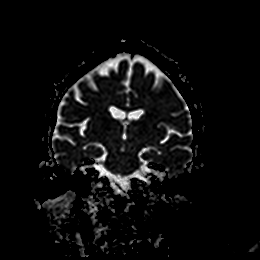
[im 36/36]
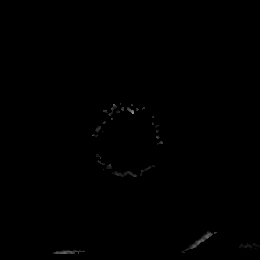

[Series 9: T1 · sagittal · 5.0mm · 0.75mm/px · 2 of 23 slices shown]
[im 1/23]
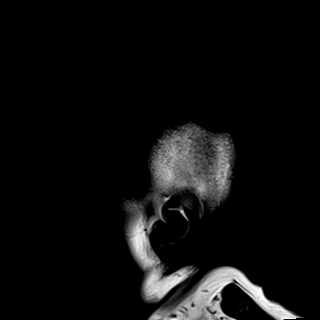
[im 23/23]
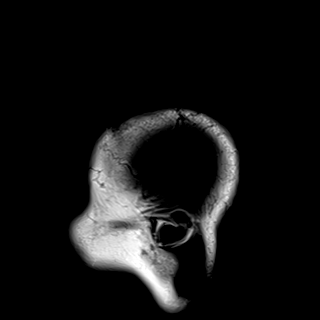

[Series 10: T2 · axial · 5.0mm · 0.72mm/px · z∈[-99,+41]mm · 2 of 25 slices shown (1 of 2)]
[im 1/25]
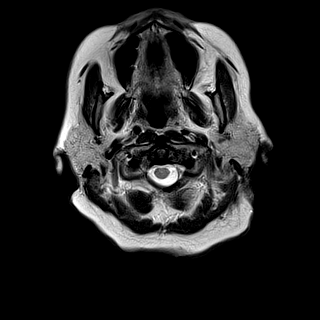
[im 25/25]
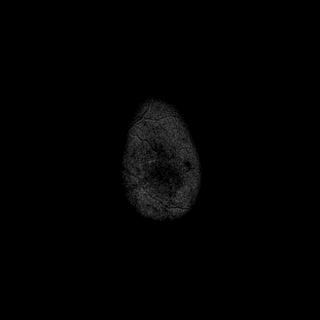

[Series 11: FLAIR · axial · 5.0mm · 0.45mm/px · z∈[-104,+35]mm · 2 of 25 slices shown]
[im 1/25]
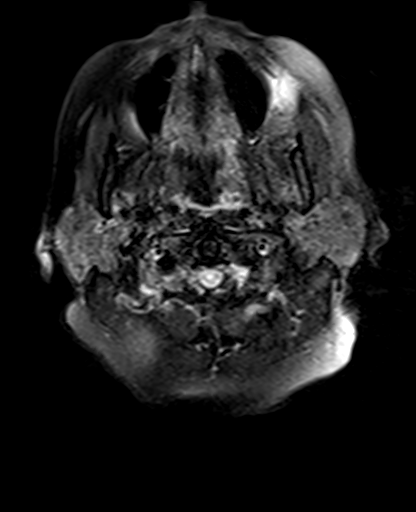
[im 25/25]
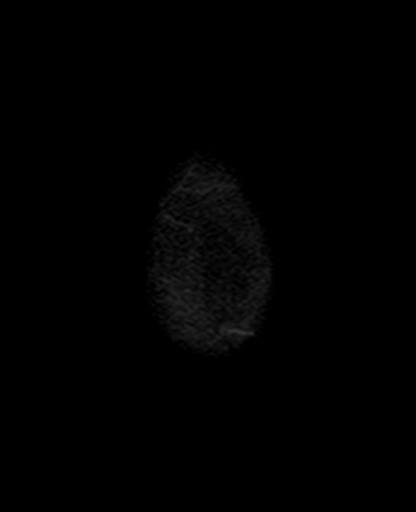

[Series 12: mag_images · axial · 3.0mm · 0.90mm/px · z∈[-111,+38]mm · 4 of 52 slices shown]
[im 1/52]
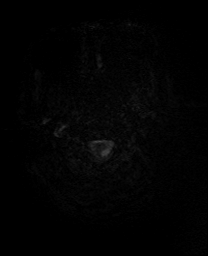
[im 18/52]
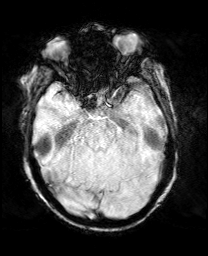
[im 35/52]
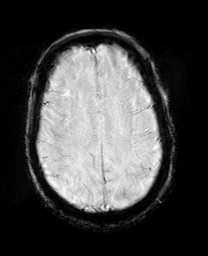
[im 52/52]
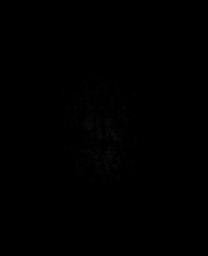

[Series 13: pha_images · axial · 3.0mm · 0.90mm/px · z∈[-108,+32]mm · 4 of 49 slices shown]
[im 1/49]
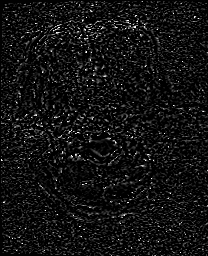
[im 17/49]
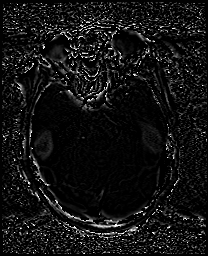
[im 33/49]
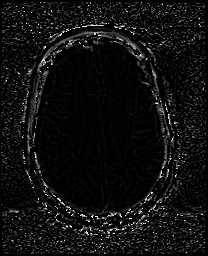
[im 49/49]
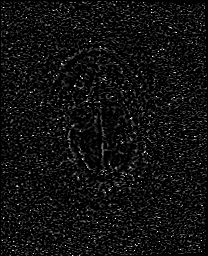

[Series 14: swi_images · axial · 3.0mm · 0.90mm/px · z∈[-111,+38]mm · 4 of 52 slices shown]
[im 1/52]
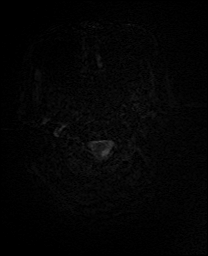
[im 18/52]
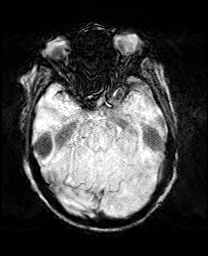
[im 35/52]
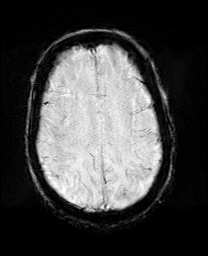
[im 52/52]
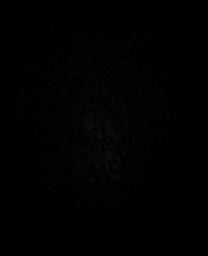

[Series 15: mip_images(sw) · axial · 24.0mm · 0.90mm/px · z∈[-101,+28]mm · 4 of 45 slices shown]
[im 1/45]
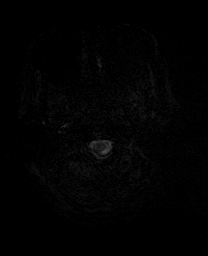
[im 15/45]
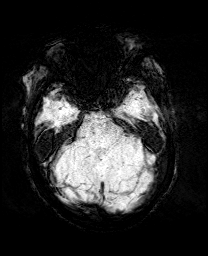
[im 30/45]
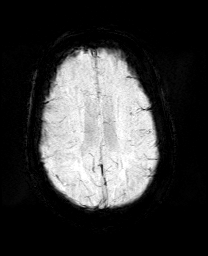
[im 45/45]
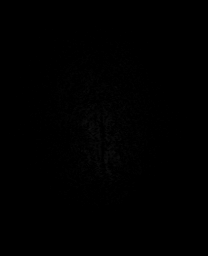

[Series 17: T2 · coronal · 5.0mm · 0.34mm/px · 2 of 31 slices shown (2 of 2)]
[im 1/31]
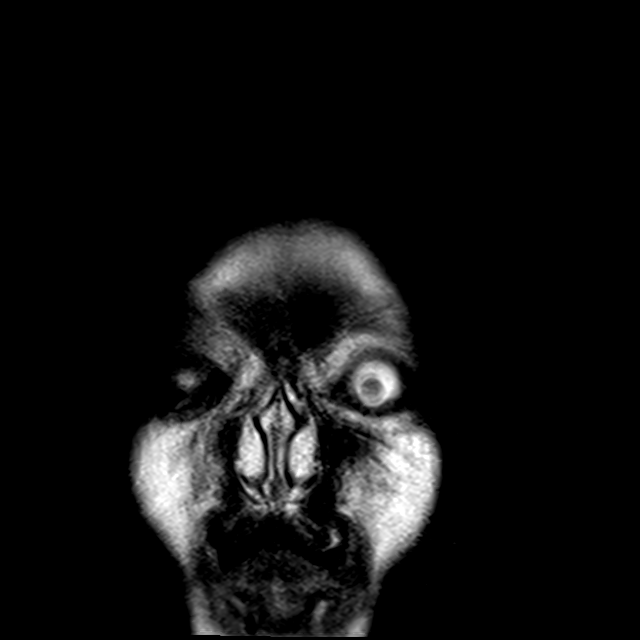
[im 31/31]
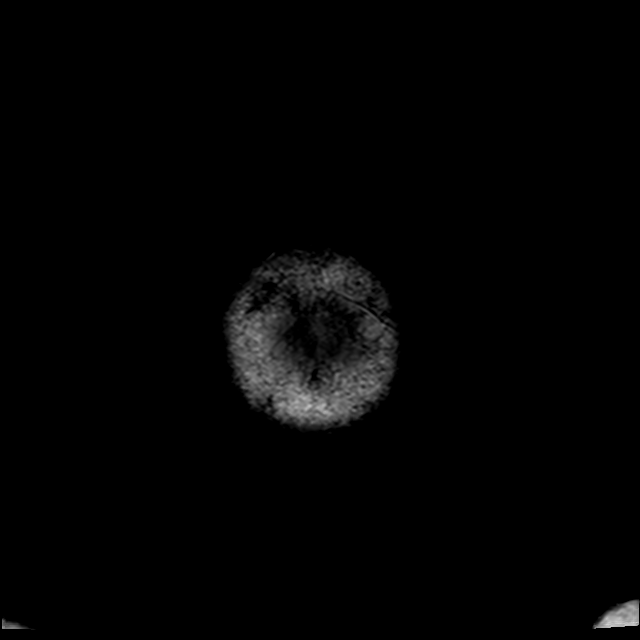

[44 of 48 positions shown; findings below may reference images not displayed]

FINDINGS: Brain: Cerebral volume within normal limits for age. Mild chronic
microvascular ischemic disease noted involving the periventricular
and deep white matter both cerebral hemispheres. Superimposed small
remote right frontal infarct. Few scattered small remote bilateral
cerebellar infarcts noted as well.

No abnormal foci of restricted diffusion to suggest acute or
subacute ischemia. Gray-white matter differentiation maintained. No
other areas of chronic cortical infarction. No evidence for acute or
chronic intracranial hemorrhage.

No mass lesion, midline shift or mass effect. No hydrocephalus or
extra-axial fluid collection. No made of an empty sella. Midline
structures intact.

Vascular: Major intracranial vascular flow voids are maintained.

Skull and upper cervical spine: Craniocervical junction within
normal limits. Bone marrow signal intensity mildly heterogeneous
without focal marrow replacing lesion. Scalp soft tissues
unremarkable.

Sinuses/Orbits: Patient status post ocular lens replacement on the
right. Paranasal sinuses are clear. No significant mastoid effusion.
Inner ear structures grossly normal.

Other: None.
IMPRESSION: 1. No acute intracranial abnormality.
2. Mild chronic microvascular ischemic disease with small remote
right frontal and bilateral cerebellar infarcts.
3. Empty sella. While this finding is often incidental in nature and
of no clinical significance, this can also be seen in the setting of
idiopathic intracranial hypertension.

## 2022-06-27 ENCOUNTER — Other Ambulatory Visit: Payer: Self-pay | Admitting: Rehabilitation

## 2022-06-27 DIAGNOSIS — M5412 Radiculopathy, cervical region: Secondary | ICD-10-CM

## 2022-06-27 DIAGNOSIS — M4322 Fusion of spine, cervical region: Secondary | ICD-10-CM

## 2022-07-10 ENCOUNTER — Other Ambulatory Visit: Payer: Medicare Other

## 2022-08-12 ENCOUNTER — Other Ambulatory Visit: Payer: Self-pay | Admitting: Family Medicine

## 2022-10-12 ENCOUNTER — Other Ambulatory Visit: Payer: Self-pay | Admitting: Family Medicine

## 2022-10-15 ENCOUNTER — Other Ambulatory Visit: Payer: Self-pay | Admitting: Family Medicine

## 2022-10-27 ENCOUNTER — Other Ambulatory Visit: Payer: Self-pay | Admitting: Family Medicine

## 2022-11-21 NOTE — Patient Instructions (Addendum)
Asthma Re-start montelukast 10 mg-take 1 tablet once a day to prevent coughing or wheezing. Patient cautioned that rarely some children/adults can experience behavioral changes after beginning montelukast. These side effects are rare, however, if you notice any change, notify the clinic and discontinue montelukast.  Increase Flovent 44 mcg-2 puffs twice a day with spacer to prevent coughing or wheezing as recommended by your pulmonologist Continue albuterol 2 puffs every 4 hours if needed for wheezing or coughing spells. You may use albuterol 2 puffs 5 to 15 minutes before exercise Keep upcoming follow up appointment with pulmonary due to moderately severe restriction. May need further imaging of chest.  Asthma control goals:  Full participation in all desired activities (may need albuterol before activity) Albuterol use two time or less a week on average (not counting use with activity) Cough interfering with sleep two time or less a month Oral steroids no more than once a year No hospitalizations   Allergic rhinitis  Continue allergen avoidance measures directed toward pollens, dust mite, cat, and cockroach as listed below  Continue Zyrtec 10 mg-take 1 tablet once a day for runny nose or itchy eyes Continue fluticasone 2 sprays per nostril once a day if needed for stuffy nose. In the right nostril, point the applicator out toward the right ear. In the left nostril, point the applicator out toward the left ear. Try using this more consistently to help with nasal congestion and sinus pressure Continue saline spray or saline rinse as needed for sinus symptoms. Use this prior to any medicated nasal sprays   Food allergy Continue avoiding shellfish.  If you have an allergic reaction from shellfish or an insect sting, take Benadryl 50 mg every 4 hours and if you have life-threatening symptoms inject with EpiPen 0.3 mg We will get lab work to follow up on your allergy. We will call you with  results once they are back.  Reflux Continue Nexium once a day We will refer you to GI due to your poorly controlled reflux Continue dietary and lifestyle modifications as below  Call us if you are not doing well on this treatment plan  Follow up in 6 weeks or sooner if needed.  Reducing Pollen Exposure The [employer] suggests the following steps to reduce your exposure to pollen during allergy seasons. Do not hang sheets or clothing out to dry; pollen may collect on these items. Do not mow lawns or spend time around freshly cut grass; mowing stirs up pollen. Keep windows closed at night.  Keep car windows closed while driving. Minimize morning activities outdoors, a time when pollen counts are usually at their highest. Stay indoors as much as possible when pollen counts or humidity is high and on windy days when pollen tends to remain in the air longer. Use air conditioning when possible.  Many air conditioners have filters that trap the pollen spores. Use a HEPA room air filter to remove pollen form the indoor air you breathe.  Control of Dog or Cat Allergen Avoidance is the best way to manage a dog or cat allergy. If you have a dog or cat and are allergic to dog or cats, consider removing the dog or cat from the home. If you have a dog or cat but don't want to find it a new home, or if your family wants a pet even though someone in the household is allergic, here are some strategies that may help keep symptoms at bay:  Keep  the pet out of your bedroom and restrict it to only a few rooms. Be advised that keeping the dog or cat in only one room will not limit the allergens to that room. Don't pet, hug or kiss the dog or cat; if you do, wash your hands with soap and water. High-efficiency particulate air (HEPA) cleaners run continuously in a bedroom or living room can reduce allergen levels over time. Regular use of a high-efficiency vacuum cleaner  or a central vacuum can reduce allergen levels. Giving your dog or cat a bath at least once a week can reduce airborne allergen.   Control of Dust Mite Allergen Dust mites play a major role in allergic asthma and rhinitis. They occur in environments with high humidity wherever human skin is found. Dust mites absorb humidity from the atmosphere (ie, they do not drink) and feed on organic matter (including shed human and animal skin). Dust mites are a microscopic type of insect that you cannot see with the naked eye. High levels of dust mites have been detected from mattresses, pillows, carpets, upholstered furniture, bed covers, clothes, soft toys and any woven material. The principal allergen of the dust mite is found in its feces. A gram of dust may contain 1,000 mites and 250,000 fecal particles. Mite antigen is easily measured in the air during house cleaning activities. Dust mites do not bite and do not cause harm to humans, other than by triggering allergies/asthma.  Ways to decrease your exposure to dust mites in your home:  1. Encase mattresses, box springs and pillows with a mite-impermeable barrier or cover  2. Wash sheets, blankets and drapes weekly in hot water (130 F) with detergent and dry them in a dryer on the hot setting.  3. Have the room cleaned frequently with a vacuum cleaner and a damp dust-mop. For carpeting or rugs, vacuuming with a vacuum cleaner equipped with a high-efficiency particulate air (HEPA) filter. The dust mite allergic individual should not be in a room which is being cleaned and should wait 1 hour after cleaning before going into the room.  4. Do not sleep on upholstered furniture (eg, couches).  5. If possible removing carpeting, upholstered furniture and drapery from the home is ideal. Horizontal blinds should be eliminated in the rooms where the person spends the most time (bedroom, study, television room). Washable vinyl, roller-type shades are  optimal.  6. Remove all non-washable stuffed toys from the bedroom. Wash stuffed toys weekly like sheets and blankets above.  7. Reduce indoor humidity to less than 50%. Inexpensive humidity monitors can be purchased at most hardware stores. Do not use a humidifier as can make the problem worse and are not recommended.  Control of Cockroach Allergen Cockroach allergen has been identified as an important cause of acute attacks of asthma, especially in urban settings.  There are fifty-five species of cockroach that exist in the [place], however only three, the [place], [place] species produce allergen that can affect patients with Asthma.  Allergens can be obtained from fecal particles, egg casings and secretions from cockroaches.    Remove food sources. Reduce access to water. Seal access and entry points. Spray runways with 0.5-1% Diazinon or Chlorpyrifos Blow boric acid power under stoves and refrigerator. Place bait stations (hydramethylnon) at feeding sites.   Lifestyle Changes for Controlling GERD When you have GERD, stomach acid feels as if it's backing up toward your mouth. Whether or not you take medication to control your GERD, your  symptoms can often be improved with lifestyle changes.   Raise Your Head Reflux is more likely to strike when you're lying down flat, because stomach fluid can flow backward more easily. Raising the head of your bed 4-6 inches can help. To do this: Slide blocks or books under the legs at the head of your bed. Or, place a wedge under the mattress. Many foam stores can make a suitable wedge for you. The wedge should run from your waist to the top of your head. Don't just prop your head on several pillows. This increases pressure on your stomach. It can make GERD worse.  Watch Your Eating Habits Certain foods may increase the acid in your stomach or relax the lower esophageal sphincter, making GERD more likely. It's best to avoid  the following: Coffee, tea, and carbonated drinks (with and without caffeine) Fatty, fried, or spicy food Mint, chocolate, onions, and tomatoes Any other foods that seem to irritate your stomach or cause you pain  Relieve the Pressure Eat smaller meals, even if you have to eat more often. Don't lie down right after you eat. Wait a few hours for your stomach to empty. Avoid tight belts and tight-fitting clothes. Lose excess weight.  Tobacco and Alcohol Avoid smoking tobacco and drinking alcohol. They can make GERD symptoms worse.

## 2022-11-22 ENCOUNTER — Other Ambulatory Visit: Payer: Self-pay

## 2022-11-22 ENCOUNTER — Ambulatory Visit (INDEPENDENT_AMBULATORY_CARE_PROVIDER_SITE_OTHER): Payer: 59 | Admitting: Family

## 2022-11-22 ENCOUNTER — Encounter: Payer: Self-pay | Admitting: Family

## 2022-11-22 VITALS — BP 124/70 | HR 76 | Temp 98.0°F | Resp 20 | Ht 61.0 in | Wt 195.6 lb

## 2022-11-22 DIAGNOSIS — J4540 Moderate persistent asthma, uncomplicated: Secondary | ICD-10-CM | POA: Diagnosis not present

## 2022-11-22 DIAGNOSIS — T7800XD Anaphylactic reaction due to unspecified food, subsequent encounter: Secondary | ICD-10-CM | POA: Diagnosis not present

## 2022-11-22 DIAGNOSIS — J3089 Other allergic rhinitis: Secondary | ICD-10-CM

## 2022-11-22 DIAGNOSIS — J984 Other disorders of lung: Secondary | ICD-10-CM

## 2022-11-22 DIAGNOSIS — J302 Other seasonal allergic rhinitis: Secondary | ICD-10-CM

## 2022-11-22 DIAGNOSIS — Z79899 Other long term (current) drug therapy: Secondary | ICD-10-CM | POA: Diagnosis not present

## 2022-11-22 DIAGNOSIS — K219 Gastro-esophageal reflux disease without esophagitis: Secondary | ICD-10-CM

## 2022-11-22 MED ORDER — EPINEPHRINE 0.3 MG/0.3ML IJ SOAJ: 0.3000 mg | INTRAMUSCULAR | 1 refills | Status: AC | PRN

## 2022-11-22 MED ORDER — MONTELUKAST SODIUM 10 MG PO TABS: 10.0000 mg | ORAL_TABLET | Freq: Every day | ORAL | 1 refills | Status: DC

## 2022-11-22 NOTE — Progress Notes (Signed)
[place] [id number] Dept: [id number]  FOLLOW UP NOTE  Patient ID: Donna Frank, female    DOB: 05/30/1946  Age: 77 y.o. MRN: [id number] Date of Office Visit: 11/22/2022  Assessment  Chief Complaint: Medication Refill, Follow-up, and Asthma  HPI Donna Frank is a 77 year old female who presents today for follow-up of moderate persistent asthma, anaphylactic shock due to food, seasonal and perennial allergic rhinitis, and current use of beta-blocker.  She was last seen on April 26, 2021 by Gareth Morgan, FNP.  Since her last office visit she was seen in the emergency room on September 28, 2022 for shortness of breath and chest pain.  Emergency room note from 09/28/2022 shows: "Patient is a 77 year old female who presented to the ED for evaluation of abnormal lab. She reports that she has been short of breath and fatigued for the past 3 weeks. She does report some intermittent chest pain. She notes that shortness of breath is only present with exertion. She was seen by cardiology today and had blood work obtained. She reports abnormal D-dimer and was sent to the ED for further evaluation. On exam, patient is afebrile, nontoxic-appearing, stable vital signs. Of independently reviewed EKG which shows normal sinus rhythm no signs of acute ischemic changes or arrhythmias. Independently reviewed chest x-ray which shows no concern for pneumonia, pneumothorax, or pulmonary edema. BNP within normal limits lowering suspicion for CHF exacerbation. Troponins negative x 2 and I do not suspect ACS. Of note, patient did have a heart cath on 08/16/2022 which was essentially unremarkable with no findings to explain patient's chest pain. Therefore, I do not believe patient needs further chest pain workup at this time. CBC and CMP stable for patient. She tested negative for COVID-19 and influenza. I did review labs from cardiology earlier as well as the provider. D-dimer resulted at 720. However,  age-adjusted, this is normal as patient is 77 years old, so abnormal to be greater than 730. I do not believe patient needs CTA at this time. She ambulated here in the ED and there was concern that her O2 dropped to 87% on room air. However, pulse ox was not working well. I initially contacted hospitalist to admit patient but hospitalist has walked patient here in the ED and patient maintain oxygen saturation between 96 to 97% on room air.   She has also had cataract surgery on January 11, 2022. On August 16, 2022 she had a left heart cath showing : "1.  Moderate, nonobstructive coronary disease.  2.  The worst lesion found was in the proximal RCA.  This test with  invasive hemodynamic testing with RFR.  It was found to be not  hemodynamically significant with RFR=0.93."  She has a history of primary open angle glaucoma, SVT, and cerebellar stroke.  Moderate persistent asthma: She reports for the past year her breathing has been worse.  She reports some dry cough and some wheezing.  She also has a little bit of tightness in her chest and shortness of breath with exertion.  She has had a couple times where she wakes up at night due to her breathing.  She has been using her albuterol inhaler 3-4 times a week and this does help.  She also mentions that she has gained weight and is trying to lose some, but it is hard due to her pinched nerve.  She is currently using Flovent 44 mcg 1 puff twice a day and she has been out of montelukast  for the past 3 months. Her PFT from 05/10/22 shows:"PFT 05/10/2022 ratio 93% FEV1 71% FVC 76% DLCO 59%."  Since her last office visit she has not required any systemic steroids.  She reports around age 58 she started smoking socially, but in her late 57s or early 23s she was smoking more.  She quit smoking around 2010.  Anaphylactic shock due to food: She continues to avoid shellfish without any accidental ingestion or use of her epinephrine auto device.  She reports her EpiPen is  out of date.  She reports around 2003 her tongue swelled up after eating some steak that was cross contaminated with shrimp.  She also mentions that she stays away from bees.  She reports years ago she was stung on her hand and had swelling and ran a fever.  Seasonal and perennial allergic rhinitis: She reports clear rhinorrhea, nasal congestion, postnasal drip, and sinus pressure.  She takes Zyrtec and this helps some.  She has fluticasone and uses as needed.  She uses montelukast 10 mg once a day, but has been out for 3 months.  When she was on  montelukast helped with her allergy symptoms.  She also mentions that she is had year-round swelling of her eyes when she wakes up.  She has been to her eye doctor and reports that everything is okay and that it is due to her allergies.  She denies itchy watery eyes, but mentions that she has dry eyes and glaucoma.  Her skin test and 2020 was positive to dust mite, cat, cockroach, grass pollen, weed pollen and tree pollen.   Drug Allergies:  Allergies  Allergen Reactions   Naproxen Sodium Nausea And Vomiting and Palpitations    Acid reflux   Shellfish Allergy Itching and Swelling   Codeine    Prednisone Nausea And Vomiting    Only allergic to pink pills. Can take injection ok.    Sulfa Antibiotics    Levofloxacin Itching and Other (See Comments)    Insomnia Insomnia, itching, myalgia     Review of Systems: Review of Systems  Constitutional:  Negative for chills and fever.  HENT:         Reports clear rhinorrhea, nasal congestion, postnasal drip, and sinus pressure  Eyes:        Reports dry eyes.  She also reports she has glaucoma  Respiratory:  Positive for cough, shortness of breath and wheezing.        Reports some dry cough and wheezing.  She also has a little bit of tightness in her chest and shortness of breath with exertion.  A couple times she has woken up due to breathing problems.  Cardiovascular:  Negative for chest pain and  palpitations.  Gastrointestinal:        Reports reflux symptoms approximately 3 times a week taking Nexium once a day  Genitourinary:  Negative for frequency.  Skin:  Negative for itching and rash.  Neurological:  Positive for headaches.  Endo/Heme/Allergies:  Positive for environmental allergies.     Physical Exam: BP 124/70 (BP Location: Left Arm, Patient Position: Sitting, Cuff Size: Normal)   Pulse 76   Temp 98 F (36.7 C) (Temporal)   Resp 20   Ht 5\' 1"  (1.549 m)   Wt 195 lb 9.6 oz (88.7 kg)   SpO2 98%   BMI 36.96 kg/m    Physical Exam Constitutional:      Appearance: Normal appearance.  HENT:     Head: Normocephalic and atraumatic.  Comments: Pharynx normal, eyes normal, ears normal, nose: Bilateral lower turbinates moderately edematous and slightly erythematous with clear drainage noted    Right Ear: Tympanic membrane, ear canal and external ear normal.     Left Ear: Tympanic membrane, ear canal and external ear normal.     Mouth/Throat:     Mouth: Mucous membranes are moist.     Pharynx: Oropharynx is clear.  Eyes:     Conjunctiva/sclera: Conjunctivae normal.  Cardiovascular:     Rate and Rhythm: Regular rhythm.     Heart sounds: Normal heart sounds.  Pulmonary:     Effort: Pulmonary effort is normal.     Breath sounds: Normal breath sounds.     Comments: Lungs clear to auscultation Musculoskeletal:     Cervical back: Neck supple.  Skin:    General: Skin is warm.  Neurological:     Mental Status: She is alert and oriented to person, place, and time.  Psychiatric:        Mood and Affect: Mood normal.        Behavior: Behavior normal.        Thought Content: Thought content normal.        Judgment: Judgment normal.     Diagnostics: FVC 1.21 L (58%), FEV1 0.87 L (53%).  Predicted FVC 2.09 L, predicted FEV1 1.63 L.  Spirometry indicates moderately severe restriction.  After 4 puffs of Xopenex, post bronchodilator response shows FVC 1.27 L (61%), FEV1  0.93 L (57%).  There is only 7% change in FEV1.  Spirometry indicates stable moderately severe restriction.  Assessment and Plan: 1. Anaphylactic shock due to food, subsequent encounter   2. Seasonal and perennial allergic rhinitis   3. Current use of beta blocker   4. Moderate persistent asthma without complication   5. Gastroesophageal reflux disease without esophagitis   6. Restrictive lung disease     Meds ordered this encounter  Medications   EPINEPHrine 0.3 mg/0.3 mL IJ SOAJ injection    Sig: Inject 0.3 mg into the muscle as needed for anaphylaxis.    Dispense:  1 each    Refill:  1   montelukast (SINGULAIR) 10 MG tablet    Sig: Take 1 tablet (10 mg total) by mouth at bedtime. To prevent coughing or wheezing.    Dispense:  90 tablet    Refill:  1    Patient Instructions   Asthma Re-start montelukast 10 mg-take 1 tablet once a day to prevent coughing or wheezing. Patient cautioned that rarely some children/adults can experience behavioral changes after beginning montelukast. These side effects are rare, however, if you notice any change, notify the clinic and discontinue montelukast.  Increase Flovent 44 mcg-2 puffs twice a day with spacer to prevent coughing or wheezing as recommended by your pulmonologist Continue albuterol 2 puffs every 4 hours if needed for wheezing or coughing spells. You may use albuterol 2 puffs 5 to 15 minutes before exercise Keep upcoming follow up appointment with pulmonary due to moderately severe restriction. May need further imaging of chest.  Asthma control goals:  Full participation in all desired activities (may need albuterol before activity) Albuterol use two time or less a week on average (not counting use with activity) Cough interfering with sleep two time or less a month Oral steroids no more than once a year No hospitalizations   Allergic rhinitis  Continue allergen avoidance measures directed toward pollens, dust mite, cat, and  cockroach as listed below  Continue Zyrtec 10 mg-take  1 tablet once a day for runny nose or itchy eyes Continue fluticasone 2 sprays per nostril once a day if needed for stuffy nose. In the right nostril, point the applicator out toward the right ear. In the left nostril, point the applicator out toward the left ear. Try using this more consistently to help with nasal congestion and sinus pressure Continue saline spray or saline rinse as needed for sinus symptoms. Use this prior to any medicated nasal sprays   Food allergy Continue avoiding shellfish.  If you have an allergic reaction from shellfish or an insect sting, take Benadryl 50 mg every 4 hours and if you have life-threatening symptoms inject with EpiPen 0.3 mg We will get lab work to follow up on your allergy. We will call you with results once they are back.  Reflux Continue Nexium once a day We will refer you to GI due to your poorly controlled reflux Continue dietary and lifestyle modifications as below  Call us if you are not doing well on this treatment plan  Follow up in 6 weeks or sooner if needed.  Reducing Pollen Exposure The American Academy of Allergy, Asthma and Immunology suggests the following steps to reduce your exposure to pollen during allergy seasons. Do not hang sheets or clothing out to dry; pollen may collect on these items. Do not mow lawns or spend time around freshly cut grass; mowing stirs up pollen. Keep windows closed at night.  Keep car windows closed while driving. Minimize morning activities outdoors, a time when pollen counts are usually at their highest. Stay indoors as much as possible when pollen counts or humidity is high and on windy days when pollen tends to remain in the air longer. Use air conditioning when possible.  Many air conditioners have filters that trap the pollen spores. Use a HEPA room air filter to remove pollen form the indoor air you breathe.  Control of Dog or Cat  Allergen Avoidance is the best way to manage a dog or cat allergy. If you have a dog or cat and are allergic to dog or cats, consider removing the dog or cat from the home. If you have a dog or cat but don't want to find it a new home, or if your family wants a pet even though someone in the household is allergic, here are some strategies that may help keep symptoms at bay:  Keep the pet out of your bedroom and restrict it to only a few rooms. Be advised that keeping the dog or cat in only one room will not limit the allergens to that room. Don't pet, hug or kiss the dog or cat; if you do, wash your hands with soap and water. High-efficiency particulate air (HEPA) cleaners run continuously in a bedroom or living room can reduce allergen levels over time. Regular use of a high-efficiency vacuum cleaner or a central vacuum can reduce allergen levels. Giving your dog or cat a bath at least once a week can reduce airborne allergen.   Control of Dust Mite Allergen Dust mites play a major role in allergic asthma and rhinitis. They occur in environments with high humidity wherever human skin is found. Dust mites absorb humidity from the atmosphere (ie, they do not drink) and feed on organic matter (including shed human and animal skin). Dust mites are a microscopic type of insect that you cannot see with the naked eye. High levels of dust mites have been detected from mattresses, pillows, carpets, upholstered furniture,  bed covers, clothes, soft toys and any woven material. The principal allergen of the dust mite is found in its feces. A gram of dust may contain 1,000 mites and 250,000 fecal particles. Mite antigen is easily measured in the air during house cleaning activities. Dust mites do not bite and do not cause harm to humans, other than by triggering allergies/asthma.  Ways to decrease your exposure to dust mites in your home:  1. Encase mattresses, box springs and pillows with a mite-impermeable  barrier or cover  2. Wash sheets, blankets and drapes weekly in hot water (130 F) with detergent and dry them in a dryer on the hot setting.  3. Have the room cleaned frequently with a vacuum cleaner and a damp dust-mop. For carpeting or rugs, vacuuming with a vacuum cleaner equipped with a high-efficiency particulate air (HEPA) filter. The dust mite allergic individual should not be in a room which is being cleaned and should wait 1 hour after cleaning before going into the room.  4. Do not sleep on upholstered furniture (eg, couches).  5. If possible removing carpeting, upholstered furniture and drapery from the home is ideal. Horizontal blinds should be eliminated in the rooms where the person spends the most time (bedroom, study, television room). Washable vinyl, roller-type shades are optimal.  6. Remove all non-washable stuffed toys from the bedroom. Wash stuffed toys weekly like sheets and blankets above.  7. Reduce indoor humidity to less than 50%. Inexpensive humidity monitors can be purchased at most hardware stores. Do not use a humidifier as can make the problem worse and are not recommended.  Control of Cockroach Allergen Cockroach allergen has been identified as an important cause of acute attacks of asthma, especially in urban settings.  There are fifty-five species of cockroach that exist in the [place], however only three, the [place], [place] and Oriental species produce allergen that can affect patients with Asthma.  Allergens can be obtained from fecal particles, egg casings and secretions from cockroaches.    Remove food sources. Reduce access to water. Seal access and entry points. Spray runways with 0.5-1% Diazinon or Chlorpyrifos Blow boric acid power under stoves and refrigerator. Place bait stations (hydramethylnon) at feeding sites.   Lifestyle Changes for Controlling GERD When you have GERD, stomach acid feels as if it's backing up toward your  mouth. Whether or not you take medication to control your GERD, your symptoms can often be improved with lifestyle changes.   Raise Your Head Reflux is more likely to strike when you're lying down flat, because stomach fluid can flow backward more easily. Raising the head of your bed 4-6 inches can help. To do this: Slide blocks or books under the legs at the head of your bed. Or, place a wedge under the mattress. Many foam stores can make a suitable wedge for you. The wedge should run from your waist to the top of your head. Don't just prop your head on several pillows. This increases pressure on your stomach. It can make GERD worse.  Watch Your Eating Habits Certain foods may increase the acid in your stomach or relax the lower esophageal sphincter, making GERD more likely. It's best to avoid the following: Coffee, tea, and carbonated drinks (with and without caffeine) Fatty, fried, or spicy food Mint, chocolate, onions, and tomatoes Any other foods that seem to irritate your stomach or cause you pain  Relieve the Pressure Eat smaller meals, even if you have to eat more often. Don't lie  down right after you eat. Wait a few hours for your stomach to empty. Avoid tight belts and tight-fitting clothes. Lose excess weight.  Tobacco and Alcohol Avoid smoking tobacco and drinking alcohol. They can make GERD symptoms worse.  Return in about 6 weeks (around 01/03/2023), or if symptoms worsen or fail to improve.    Thank you for the opportunity to care for this patient.  Please do not hesitate to contact me with questions.  Nehemiah Settle, FNP [facility]

## 2022-11-25 ENCOUNTER — Telehealth: Payer: Self-pay | Admitting: Family

## 2022-11-25 LAB — ALLERGEN PROFILE, SHELLFISH
Clam IgE: <0.1 kU/L
F023-IgE Crab: 0.12 kU/L — AB
F080-IgE Lobster: 0.1 kU/L — AB
F290-IgE Oyster: <0.1 kU/L
Scallop IgE: <0.1 kU/L
Shrimp IgE: 0.15 kU/L — AB

## 2022-11-25 NOTE — Telephone Encounter (Signed)
Internal referral placed with [facility] with Dr. Jackquline Denmark.  They will call patient to schedule.

## 2022-11-30 NOTE — Progress Notes (Signed)
Please let Fianna know that we received her lab work.   Her lab work for shellfish is low enough that we could offer an in office oral food challenge to plain not breaded shrimp. She would need to bring 14 small shrimp with her the day of the challenge. She would need to be in good health the day of the challenge and off all antihistamines 3 days prior to the appointment. This appointment will last 2-3 hours. We would do skin testing to shellfish first before starting the challenge. If she is not interested in an in office oral food challenge she needs to continue to avoid shellfish and have access to her epinephrine auto injector device at all times.

## 2022-12-07 ENCOUNTER — Telehealth: Payer: Self-pay | Admitting: Physician Assistant

## 2022-12-07 NOTE — Telephone Encounter (Signed)
Patient called and stated she hasn't heard anything about a referral for a gastrologist. Requested a call back at [phone].

## 2022-12-08 NOTE — Telephone Encounter (Signed)
I also informed Donna Frank of her lab results, she stated she's going to think about the in office oral challenge to shrimp and give our office a call back if she decides to do it.   [name] [phone]

## 2022-12-08 NOTE — Telephone Encounter (Signed)
Sending a referral to the admin pool now for poorly controled reflux Calling to let [facility] know.   Arlinda [phone]

## 2022-12-22 ENCOUNTER — Telehealth: Payer: Self-pay

## 2022-12-22 NOTE — Telephone Encounter (Signed)
It looks like on 12/07/22 she saw pulmonary and she was put on budesonide via nebulizer twice a day. Can you find out what medication she is using- Pulmicort (budesonide) or Flovent 44 mcg.

## 2022-12-22 NOTE — Telephone Encounter (Signed)
Donna Frank patient mentioned that we were to get her a referral for a gastroenterologist

## 2022-12-22 NOTE — Telephone Encounter (Signed)
Spoke with patient. She said her pulmonologist didn't change or add any medications she was taking. She is taking the flovent 44 mcg 2 puffs in the morning and she remembers 3 nights out of 7 to take her 2 puffs at night. She has 2 boxes of the flovent 44 mcg. One box expires 04/25 and one box 10/24. She is not on any budesonide 0.5 mg for nebulizer machine.

## 2022-12-22 NOTE — Telephone Encounter (Signed)
Received a fax from [facility] that patients insurance is not covering the flovent 44 hfa the alternatives preferred are arnuity, qvar redihaler. Patient is not aware yet.

## 2022-12-24 NOTE — Telephone Encounter (Signed)
Tell her that I am so sorry for the delay. Please refer to GI for reflux

## 2022-12-26 ENCOUNTER — Telehealth: Payer: Self-pay

## 2022-12-26 NOTE — Telephone Encounter (Signed)
Pts insurance does not cover fluticasone (flovent) please advise to change to either arnuity or qvar

## 2022-12-26 NOTE — Telephone Encounter (Signed)
Pt is only doing flovent she has no pulmicort or budesonide

## 2022-12-26 NOTE — Telephone Encounter (Signed)
Please call her pulmonologist and clarify if they are wanting her on Pulmicort because that is what there last note states

## 2022-12-26 NOTE — Telephone Encounter (Signed)
I sent a message to [facility] on this on Thursday. It looks like Pulmonary has her on Pulmicort via nebulizer twice a day. Please find out if she is taking Pulmicort via nebulizer

## 2022-12-26 NOTE — Telephone Encounter (Signed)
Please refer to gi for reflux thank you

## 2022-12-28 NOTE — Telephone Encounter (Signed)
Pulmonary said she is to be taking albuterol pulmicort epi pen acuneb

## 2022-12-28 NOTE — Telephone Encounter (Signed)
Pt does not have Pulmicort informed her to call pulmonary and get that rx sent over she said she would call today for that

## 2022-12-28 NOTE — Telephone Encounter (Signed)
Please have her take Pulmicort as prescribed by pulmonology. She can call their office for a prescription since she does not seem to have this medication

## 2023-01-04 NOTE — Patient Instructions (Signed)
Asthma Continue montelukast 10 mg-take 1 tablet once a day to prevent coughing or wheezing.   Flovent 44 mcg-2 puffs twice a day with spacer to prevent coughing or wheezing as recommended by your pulmonologist Continue albuterol 2 puffs every 4 hours if needed for wheezing or coughing spells. You may use albuterol 2 puffs 5 to 15 minutes before exercise Keep upcoming follow up appointment with pulmonary due to moderately severe restriction. May need further imaging of chest.  Asthma control goals:  Full participation in all desired activities (may need albuterol before activity) Albuterol use two time or less a week on average (not counting use with activity) Cough interfering with sleep two time or less a month Oral steroids no more than once a year No hospitalizations   Allergic rhinitis  -Continue allergen avoidance measures directed toward pollens, dust mite, cat, and cockroach as listed below  -Continue Zyrtec 10 mg-take 1 tablet once a day for runny nose or itchy eyes -Continue fluticasone 2 sprays per nostril once a day if needed for stuffy nose. In the right nostril, point the applicator out toward the right ear. In the left nostril, point the applicator out toward the left ear. Try using this more consistently to help with nasal congestion and sinus pressure -Continue saline spray or saline rinse as needed for sinus symptoms. Use this prior to any medicated nasal sprays   Food allergy -Continue avoiding shellfish.  If you have an allergic reaction from shellfish or an insect sting, take Benadryl 50 mg every 4 hours and if you have life-threatening symptoms inject with EpiPen 0.3 mg -Your lab work for shellfish is low enough that we could offer an in office oral food challenge to plain not breaded shrimp. You would need to bring 14 small shrimp with her the day of the challenge. She would need to be in good health the day of the challenge and off all antihistamines 3 days prior to  the appointment. This appointment will last 2-3 hours. We would do skin testing to shellfish first before starting the challenge. If she is not interested in an in office oral food challenge she needs to continue to avoid shellfish and have access to her epinephrine auto injector device at all times.   Reflux -Continue Nexium once a day -Keep appointment with GI -Continue dietary and lifestyle modifications as below  Call us if you are not doing well on this treatment plan  Follow up in months or sooner if needed.  Reducing Pollen Exposure The American Academy of Allergy, Asthma and Immunology suggests the following steps to reduce your exposure to pollen during allergy seasons. Do not hang sheets or clothing out to dry; pollen may collect on these items. Do not mow lawns or spend time around freshly cut grass; mowing stirs up pollen. Keep windows closed at night.  Keep car windows closed while driving. Minimize morning activities outdoors, a time when pollen counts are usually at their highest. Stay indoors as much as possible when pollen counts or humidity is high and on windy days when pollen tends to remain in the air longer. Use air conditioning when possible.  Many air conditioners have filters that trap the pollen spores. Use a HEPA room air filter to remove pollen form the indoor air you breathe.  Control of Dog or Cat Allergen Avoidance is the best way to manage a dog or cat allergy. If you have a dog or cat and are allergic to dog or cats, consider removing  the dog or cat from the home. If you have a dog or cat but don't want to find it a new home, or if your family wants a pet even though someone in the household is allergic, here are some strategies that may help keep symptoms at bay:  Keep the pet out of your bedroom and restrict it to only a few rooms. Be advised that keeping the dog or cat in only one room will not limit the allergens to that room. Don't pet, hug or kiss the  dog or cat; if you do, wash your hands with soap and water. High-efficiency particulate air (HEPA) cleaners run continuously in a bedroom or living room can reduce allergen levels over time. Regular use of a high-efficiency vacuum cleaner or a central vacuum can reduce allergen levels. Giving your dog or cat a bath at least once a week can reduce airborne allergen.   Control of Dust Mite Allergen Dust mites play a major role in allergic asthma and rhinitis. They occur in environments with high humidity wherever human skin is found. Dust mites absorb humidity from the atmosphere (ie, they do not drink) and feed on organic matter (including shed human and animal skin). Dust mites are a microscopic type of insect that you cannot see with the naked eye. High levels of dust mites have been detected from mattresses, pillows, carpets, upholstered furniture, bed covers, clothes, soft toys and any woven material. The principal allergen of the dust mite is found in its feces. A gram of dust may contain 1,000 mites and 250,000 fecal particles. Mite antigen is easily measured in the air during house cleaning activities. Dust mites do not bite and do not cause harm to humans, other than by triggering allergies/asthma.  Ways to decrease your exposure to dust mites in your home:  1. Encase mattresses, box springs and pillows with a mite-impermeable barrier or cover  2. Wash sheets, blankets and drapes weekly in hot water (130 F) with detergent and dry them in a dryer on the hot setting.  3. Have the room cleaned frequently with a vacuum cleaner and a damp dust-mop. For carpeting or rugs, vacuuming with a vacuum cleaner equipped with a high-efficiency particulate air (HEPA) filter. The dust mite allergic individual should not be in a room which is being cleaned and should wait 1 hour after cleaning before going into the room.  4. Do not sleep on upholstered furniture (eg, couches).  5. If possible removing  carpeting, upholstered furniture and drapery from the home is ideal. Horizontal blinds should be eliminated in the rooms where the person spends the most time (bedroom, study, television room). Washable vinyl, roller-type shades are optimal.  6. Remove all non-washable stuffed toys from the bedroom. Wash stuffed toys weekly like sheets and blankets above.  7. Reduce indoor humidity to less than 50%. Inexpensive humidity monitors can be purchased at most hardware stores. Do not use a humidifier as can make the problem worse and are not recommended.  Control of Cockroach Allergen Cockroach allergen has been identified as an important cause of acute attacks of asthma, especially in urban settings.  There are fifty-five species of cockroach that exist in the [place], however only three, the [place], [place] species produce allergen that can affect patients with Asthma.  Allergens can be obtained from fecal particles, egg casings and secretions from cockroaches.    Remove food sources. Reduce access to water. Seal access and entry points. Spray runways with 0.5-1%  Diazinon or Chlorpyrifos Blow boric acid power under stoves and refrigerator. Place bait stations (hydramethylnon) at feeding sites.   Lifestyle Changes for Controlling GERD When you have GERD, stomach acid feels as if it's backing up toward your mouth. Whether or not you take medication to control your GERD, your symptoms can often be improved with lifestyle changes.   Raise Your Head Reflux is more likely to strike when you're lying down flat, because stomach fluid can flow backward more easily. Raising the head of your bed 4-6 inches can help. To do this: Slide blocks or books under the legs at the head of your bed. Or, place a wedge under the mattress. Many foam stores can make a suitable wedge for you. The wedge should run from your waist to the top of your head. Don't just prop your head on several pillows.  This increases pressure on your stomach. It can make GERD worse.  Watch Your Eating Habits Certain foods may increase the acid in your stomach or relax the lower esophageal sphincter, making GERD more likely. It's best to avoid the following: Coffee, tea, and carbonated drinks (with and without caffeine) Fatty, fried, or spicy food Mint, chocolate, onions, and tomatoes Any other foods that seem to irritate your stomach or cause you pain  Relieve the Pressure Eat smaller meals, even if you have to eat more often. Don't lie down right after you eat. Wait a few hours for your stomach to empty. Avoid tight belts and tight-fitting clothes. Lose excess weight.  Tobacco and Alcohol Avoid smoking tobacco and drinking alcohol. They can make GERD symptoms worse.

## 2023-01-04 NOTE — Telephone Encounter (Signed)
Spoke with pt again and she still does not have the pulmicort informed her to call pulminology about getting rx sent in for pulmicort

## 2023-01-05 ENCOUNTER — Ambulatory Visit (INDEPENDENT_AMBULATORY_CARE_PROVIDER_SITE_OTHER): Payer: 59 | Admitting: Family

## 2023-01-05 ENCOUNTER — Other Ambulatory Visit: Payer: Self-pay

## 2023-01-05 ENCOUNTER — Encounter: Payer: Self-pay | Admitting: Family

## 2023-01-05 VITALS — BP 130/76 | HR 77 | Temp 98.8°F | Resp 18 | Wt 192.8 lb

## 2023-01-05 DIAGNOSIS — J4540 Moderate persistent asthma, uncomplicated: Secondary | ICD-10-CM | POA: Diagnosis not present

## 2023-01-05 DIAGNOSIS — K219 Gastro-esophageal reflux disease without esophagitis: Secondary | ICD-10-CM

## 2023-01-05 DIAGNOSIS — T7800XD Anaphylactic reaction due to unspecified food, subsequent encounter: Secondary | ICD-10-CM

## 2023-01-05 DIAGNOSIS — J3089 Other allergic rhinitis: Secondary | ICD-10-CM | POA: Diagnosis not present

## 2023-01-05 DIAGNOSIS — J984 Other disorders of lung: Secondary | ICD-10-CM

## 2023-01-05 DIAGNOSIS — J302 Other seasonal allergic rhinitis: Secondary | ICD-10-CM

## 2023-01-05 NOTE — Progress Notes (Signed)
[place] [address] Dept: [phone]  FOLLOW UP NOTE  Patient ID: Donna Frank, female    DOB: 1946-03-25  Age: 77 y.o. MRN: [id number] Date of Office Visit: 01/05/2023  Assessment  Chief Complaint: No chief complaint on file.  HPI Donna Frank is a 77 year old female who presents today for follow-up of anaphylactic shock due to food, seasonal and perennial allergic rhinitis, current use of a beta-blocker, moderate persistent asthma without complication, gastroesophageal reflux disease, and restrictive lung disease.  She was last seen on November 22, 2022 by myself.  She denies any new diagnosis or surgery since her last office visit.  Asthma/restrictive lung disease: She is currently taking Flovent 44 mcg 2 puffs twice a day with spacer and montelukast 10 mg once a day.  She has albuterol to use as needed.  She reports that her pulmonologist just sent refills for albuterol to use in her nebulizer.  Discussed that after looking at her last office visit with her pulmonologist that he wanted her on Pulmicort.  Instructed her to find out whether he wanted her on Pulmicort to use via her nebulizer or to continue the Flovent 44 mcg 2 puffs twice a day with a spacer.  She denies cough, wheeze, tightness in chest, shortness of breath, and nocturnal awakenings due to breathing problems.  Since her last office visit she has not made any trips to the emergency room or urgent care due to breathing problems.  She did finish a round of steroids Monday due to the arthritis in her left knee.  She has not used her albuterol any so far this week, but she did use her albuterol twice last week.  Allergic rhinitis: She reports that she is having sinus issues.  She reports sinus pressure, nasal congestion, and swelling of eyes.  She also has postnasal drip especially in the morning.  She denies rhinorrhea.  She has been to her eye doctor when she has had these symptoms and has been told that her  exam is normal and that they feel it is due to her allergies.  She does have glaucoma.  She is currently taking Zyrtec 10 mg once a day.  She uses her Flonase 1 spray each nostril once a day.  Discussed that fluticasone can increase the pressure in her eyes.  She also reports that her eyes can be itchy and really dry.  She uses Systane eyedrops.  Discussed starting allergy injections as an option.She denies being on atenolol.  Instructed to her to bring her medicines with her to her next office visit.  Food allergy: She continues to avoid shellfish without any accidental ingestion or use of her epinephrine autoinjector device.  Reflux: She is currently taking Nexium once a day.  She still continues to have little bit of GERD.  She would like to be checked by GI.  She also mentions that she has a hiatal hernia.  She has not heard about an appointment since we requested referral with GI last time.  She would prefer to see a GI here in [facility].   Drug Allergies:  Allergies  Allergen Reactions   Naproxen Sodium Nausea And Vomiting and Palpitations    Acid reflux   Shellfish Allergy Itching and Swelling   Codeine    Prednisone Nausea And Vomiting    Only allergic to pink pills. Can take injection ok.    Sulfa Antibiotics    Levofloxacin Itching and Other (See Comments)  Insomnia Insomnia, itching, myalgia     Review of Systems: Review of Systems  Constitutional:  Negative for chills and fever.  HENT:         Reports nasal congestion, postnasal drip, especially in the morning, and sinus pressure.  Denies rhinorrhea  Eyes:        Reports itchy eyes and her eyes are really dry.  She uses Systane  Respiratory:  Negative for cough, shortness of breath and wheezing.        Denies cough, wheeze, tightness in chest, shortness of breath, and nocturnal awakenings due to breathing problems  Cardiovascular:  Negative for chest pain and palpitations.  Gastrointestinal:        Reports reflux on  Nexium daily  Genitourinary:  Negative for frequency.  Skin:  Positive for itching. Negative for rash.       Reports itching occasionally.  Denies rash  Neurological:  Negative for headaches.  Endo/Heme/Allergies:  Positive for environmental allergies.     Physical Exam: BP 130/76 (BP Location: Left Arm, Patient Position: Sitting, Cuff Size: Large)   Pulse 77   Temp 98.8 F (37.1 C) (Temporal)   Resp 18   Wt 192 lb 12.8 oz (87.5 kg)   SpO2 99%   BMI 36.43 kg/m    Physical Exam Constitutional:      Appearance: Normal appearance.  HENT:     Head: Normocephalic and atraumatic.     Comments: Pharynx normal, eyes normal, ears, nose: Bilateral lower turbinates moderately edematous and pale with no drainage noted    Right Ear: Tympanic membrane, ear canal and external ear normal.     Left Ear: Tympanic membrane, ear canal and external ear normal.     Mouth/Throat:     Mouth: Mucous membranes are moist.     Pharynx: Oropharynx is clear.  Cardiovascular:     Rate and Rhythm: Normal rate and regular rhythm.     Heart sounds: Normal heart sounds.  Pulmonary:     Effort: Pulmonary effort is normal.     Breath sounds: Normal breath sounds.     Comments: Lungs clear to auscultation Musculoskeletal:     Cervical back: Neck supple.  Skin:    General: Skin is warm.  Neurological:     Mental Status: She is alert and oriented to person, place, and time.  Psychiatric:        Mood and Affect: Mood normal.        Behavior: Behavior normal.        Thought Content: Thought content normal.        Judgment: Judgment normal.     Diagnostics: None.  Assessment and Plan: 1. Moderate persistent asthma without complication   2. Restrictive lung disease   3. Seasonal and perennial allergic rhinitis   4. Gastroesophageal reflux disease without esophagitis   5. Anaphylactic shock due to food, subsequent encounter     No orders of the defined types were placed in this  encounter.   Patient Instructions   Asthma Continue montelukast 10 mg-take 1 tablet once a day to prevent coughing or wheezing.   Flovent 44 mcg-2 puffs twice a day with spacer to prevent coughing or wheezing as recommended by your pulmonologist (see if your pulmnologist wants you on Pulmicort (buduesonide) or Flovent Continue albuterol 2 puffs every 4 hours if needed for wheezing or coughing spells. You may use albuterol 2 puffs 5 to 15 minutes before exercise Keep upcoming follow up appointment with pulmonary due to  moderately severe restriction. May need further imaging of chest.  Asthma control goals:  Full participation in all desired activities (may need albuterol before activity) Albuterol use two time or less a week on average (not counting use with activity) Cough interfering with sleep two time or less a month Oral steroids no more than once a year No hospitalizations   Allergic rhinitis  -Continue allergen avoidance measures directed toward pollens, dust mite, cat, and cockroach as listed below  -Continue Zyrtec 10 mg-take 1 tablet once a day for runny nose or itchy eyes -Continue fluticasone 1 spray per nostril once a day if needed for stuffy nose. In the right nostril, point the applicator out toward the right ear. In the left nostril, point the applicator out toward the left ear. Try using this more consistently to help with nasal congestion and sinus pressure - contact your eye doctor to see if you can increase your Flonase (fluticasone) nasal spray to 2 spays in each nostril once a day due to your glaucoma -Continue saline spray or saline rinse as needed for sinus symptoms. Use this prior to any medicated nasal sprays - Consider allergy injections. She reports that she is not taking atenolol   Food allergy -Continue avoiding shellfish.  If you have an allergic reaction from shellfish or an insect sting, take Benadryl 50 mg every 4 hours and if you have life-threatening  symptoms inject with EpiPen 0.3 mg -Your lab work for shellfish is low enough that we could offer an in office oral food challenge to plain not breaded shrimp. You would need to bring 14 small shrimp with her the day of the challenge. She would need to be in good health the day of the challenge and off all antihistamines 3 days prior to the appointment. This appointment will last 2-3 hours. We would do skin testing to shellfish first before starting the challenge. If she is not interested in an in office oral food challenge she needs to continue to avoid shellfish and have access to her epinephrine auto injector device at all times.   Reflux -Continue Nexium once a day - we will try to refer you to a GI group in [place] since you have not heard from [place] and it would be more convenient -Continue dietary and lifestyle modifications as below  Call us if you are not doing well on this treatment plan  Follow up in 2-3 months or sooner if needed.  Reducing Pollen Exposure The American Academy of Allergy, Asthma and Immunology suggests the following steps to reduce your exposure to pollen during allergy seasons. Do not hang sheets or clothing out to dry; pollen may collect on these items. Do not mow lawns or spend time around freshly cut grass; mowing stirs up pollen. Keep windows closed at night.  Keep car windows closed while driving. Minimize morning activities outdoors, a time when pollen counts are usually at their highest. Stay indoors as much as possible when pollen counts or humidity is high and on windy days when pollen tends to remain in the air longer. Use air conditioning when possible.  Many air conditioners have filters that trap the pollen spores. Use a HEPA room air filter to remove pollen form the indoor air you breathe.  Control of Dog or Cat Allergen Avoidance is the best way to manage a dog or cat allergy. If you have a dog or cat and are allergic to dog or cats, consider  removing the dog or cat from  the home. If you have a dog or cat but don't want to find it a new home, or if your family wants a pet even though someone in the household is allergic, here are some strategies that may help keep symptoms at bay:  Keep the pet out of your bedroom and restrict it to only a few rooms. Be advised that keeping the dog or cat in only one room will not limit the allergens to that room. Don't pet, hug or kiss the dog or cat; if you do, wash your hands with soap and water. High-efficiency particulate air (HEPA) cleaners run continuously in a bedroom or living room can reduce allergen levels over time. Regular use of a high-efficiency vacuum cleaner or a central vacuum can reduce allergen levels. Giving your dog or cat a bath at least once a week can reduce airborne allergen.   Control of Dust Mite Allergen Dust mites play a major role in allergic asthma and rhinitis. They occur in environments with high humidity wherever human skin is found. Dust mites absorb humidity from the atmosphere (ie, they do not drink) and feed on organic matter (including shed human and animal skin). Dust mites are a microscopic type of insect that you cannot see with the naked eye. High levels of dust mites have been detected from mattresses, pillows, carpets, upholstered furniture, bed covers, clothes, soft toys and any woven material. The principal allergen of the dust mite is found in its feces. A gram of dust may contain 1,000 mites and 250,000 fecal particles. Mite antigen is easily measured in the air during house cleaning activities. Dust mites do not bite and do not cause harm to humans, other than by triggering allergies/asthma.  Ways to decrease your exposure to dust mites in your home:  1. Encase mattresses, box springs and pillows with a mite-impermeable barrier or cover  2. Wash sheets, blankets and drapes weekly in hot water (130 F) with detergent and dry them in a dryer on the hot  setting.  3. Have the room cleaned frequently with a vacuum cleaner and a damp dust-mop. For carpeting or rugs, vacuuming with a vacuum cleaner equipped with a high-efficiency particulate air (HEPA) filter. The dust mite allergic individual should not be in a room which is being cleaned and should wait 1 hour after cleaning before going into the room.  4. Do not sleep on upholstered furniture (eg, couches).  5. If possible removing carpeting, upholstered furniture and drapery from the home is ideal. Horizontal blinds should be eliminated in the rooms where the person spends the most time (bedroom, study, television room). Washable vinyl, roller-type shades are optimal.  6. Remove all non-washable stuffed toys from the bedroom. Wash stuffed toys weekly like sheets and blankets above.  7. Reduce indoor humidity to less than 50%. Inexpensive humidity monitors can be purchased at most hardware stores. Do not use a humidifier as can make the problem worse and are not recommended.  Control of Cockroach Allergen Cockroach allergen has been identified as an important cause of acute attacks of asthma, especially in urban settings.  There are fifty-five species of cockroach that exist in the [place], however only three, the [place], [place] species produce allergen that can affect patients with Asthma.  Allergens can be obtained from fecal particles, egg casings and secretions from cockroaches.    Remove food sources. Reduce access to water. Seal access and entry points. Spray runways with 0.5-1% Diazinon or Chlorpyrifos Blow boric acid  power under stoves and refrigerator. Place bait stations (hydramethylnon) at feeding sites.   Lifestyle Changes for Controlling GERD When you have GERD, stomach acid feels as if it's backing up toward your mouth. Whether or not you take medication to control your GERD, your symptoms can often be improved with lifestyle changes.   Raise Your  Head Reflux is more likely to strike when you're lying down flat, because stomach fluid can flow backward more easily. Raising the head of your bed 4-6 inches can help. To do this: Slide blocks or books under the legs at the head of your bed. Or, place a wedge under the mattress. Many foam stores can make a suitable wedge for you. The wedge should run from your waist to the top of your head. Don't just prop your head on several pillows. This increases pressure on your stomach. It can make GERD worse.  Watch Your Eating Habits Certain foods may increase the acid in your stomach or relax the lower esophageal sphincter, making GERD more likely. It's best to avoid the following: Coffee, tea, and carbonated drinks (with and without caffeine) Fatty, fried, or spicy food Mint, chocolate, onions, and tomatoes Any other foods that seem to irritate your stomach or cause you pain  Relieve the Pressure Eat smaller meals, even if you have to eat more often. Don't lie down right after you eat. Wait a few hours for your stomach to empty. Avoid tight belts and tight-fitting clothes. Lose excess weight.  Tobacco and Alcohol Avoid smoking tobacco and drinking alcohol. They can make GERD symptoms worse.  Return in about 2 months (around 03/07/2023), or if symptoms worsen or fail to improve.    Thank you for the opportunity to care for this patient.  Please do not hesitate to contact me with questions.  Althea Charon, FNP [facility]

## 2023-04-07 ENCOUNTER — Ambulatory Visit: Payer: 59 | Admitting: Family

## 2023-05-20 ENCOUNTER — Other Ambulatory Visit: Payer: Self-pay | Admitting: Family

## 2023-08-17 ENCOUNTER — Other Ambulatory Visit: Payer: Self-pay | Admitting: Family

## 2024-01-04 ENCOUNTER — Other Ambulatory Visit (HOSPITAL_BASED_OUTPATIENT_CLINIC_OR_DEPARTMENT_OTHER): Payer: Self-pay

## 2024-01-04 ENCOUNTER — Other Ambulatory Visit: Payer: Self-pay

## 2024-01-04 ENCOUNTER — Emergency Department (HOSPITAL_BASED_OUTPATIENT_CLINIC_OR_DEPARTMENT_OTHER)
Admission: EM | Admit: 2024-01-04 | Discharge: 2024-01-04 | Disposition: A | Discharge status: Home / Self Care | Attending: Emergency Medicine | Admitting: Emergency Medicine

## 2024-01-04 ENCOUNTER — Encounter (HOSPITAL_BASED_OUTPATIENT_CLINIC_OR_DEPARTMENT_OTHER): Payer: Self-pay

## 2024-01-04 DIAGNOSIS — E119 Type 2 diabetes mellitus without complications: Secondary | ICD-10-CM | POA: Diagnosis not present

## 2024-01-04 DIAGNOSIS — R0981 Nasal congestion: Secondary | ICD-10-CM | POA: Insufficient documentation

## 2024-01-04 DIAGNOSIS — M542 Cervicalgia: Secondary | ICD-10-CM | POA: Insufficient documentation

## 2024-01-04 DIAGNOSIS — Z79899 Other long term (current) drug therapy: Secondary | ICD-10-CM | POA: Diagnosis not present

## 2024-01-04 DIAGNOSIS — I10 Essential (primary) hypertension: Secondary | ICD-10-CM | POA: Diagnosis not present

## 2024-01-04 MED ORDER — CYCLOBENZAPRINE HCL 5 MG PO TABS
5.0000 mg | ORAL_TABLET | Freq: Two times a day (BID) | ORAL | 0 refills | Status: AC | PRN
Start: 2024-01-04 — End: ?
  Filled 2024-01-04: qty 20, 10d supply, fill #0

## 2024-01-04 MED ORDER — CYCLOBENZAPRINE HCL 5 MG PO TABS
5.0000 mg | ORAL_TABLET | Freq: Once | ORAL | Status: AC
  Administered 2024-01-04: 5 mg via ORAL
  Filled 2024-01-04: qty 1

## 2024-01-04 MED ORDER — OXYCODONE HCL 5 MG PO TABS
5.0000 mg | ORAL_TABLET | Freq: Four times a day (QID) | ORAL | 0 refills | Status: AC | PRN
  Filled 2024-01-04: qty 10, 3d supply, fill #0

## 2024-01-04 NOTE — ED Triage Notes (Addendum)
 Patient arrives POV with complaints of shortness of breath and neck pain. Patient was seen at [facility] ED for the same and had a cardiac workup.  Patient is concerned about the ongoing pain.

## 2024-01-04 NOTE — ED Provider Notes (Signed)
 [place] [facility] Provider Note   CSN: [id number] Arrival date & time: 01/04/24  1441     History  Chief Complaint  Patient presents with   Neck Pain   Shortness of Breath    Donna Frank is a 78 y.o. female.  Patient here with neck pain sometimes it goes into her upper back she was seen yesterday and had a chest pain evaluation at outside hospital that she says was normal.  She just wanted some further pain management for her neck.  She had an MRI done a month ago but has not had the ability to follow-up with anybody about it.  She is not having any weakness or numbness on her arms pain is more focally in her neck.  She has had fusion there in the past.  She denies any active chest pain shortness of breath weakness numbness tingling.  Her nephew had RSV recently but she tested negative for yesterday.  She is not having any fever or chills or respiratory symptoms at this time.  The history is provided by the patient.       Home Medications Prior to Admission medications   Medication Sig Start Date End Date Taking? Authorizing Provider  acetaminophen (TYLENOL) 500 MG tablet Take by mouth.    [provider]  albuterol (PROAIR HFA) 108 (90 Base) MCG/ACT inhaler Inhale 2 puffs into the lungs every 6 (six) hours as needed for wheezing or shortness of breath. 05/13/19   Fletcher Anon, MD  albuterol (PROVENTIL) (2.5 MG/3ML) 0.083% nebulizer solution Take 2.5 mg by nebulization every 6 (six) hours as needed for wheezing or shortness of breath.    [provider]  atenolol (TENORMIN) 25 MG tablet Take 25 mg by mouth daily. 03/11/21   [provider]  cetirizine HCl (ZYRTEC) 5 MG/5ML SOLN Take 10 mLs (10 mg total) by mouth daily. 04/27/21   Hetty Blend, FNP  cholecalciferol (VITAMIN D) 1000 UNITS tablet Take 2,000 Units by mouth daily.     [provider]  Cholecalciferol 50 MCG (2000 UT) CAPS Take 1 capsule by mouth  daily.    [provider]  clobetasol (TEMOVATE) 0.05 % external solution 1(ONE) APPLICATION(S) SCALP 2(TWO) TIMES A DAY 11/14/18   [provider]  clopidogrel (PLAVIX) 75 MG tablet Take 75 mg by mouth daily. 05/06/20   [provider]  co-enzyme Q-10 30 MG capsule Take 30 mg by mouth in the morning, at noon, and at bedtime.    [provider]  cyanocobalamin 100 MCG tablet Take 100 mcg by mouth daily. 05/16/19   [provider]  cyclobenzaprine (FLEXERIL) 10 MG tablet Take 10 mg by mouth 3 (three) times daily as needed. 03/31/21   [provider]  diazepam (VALIUM) 2 MG tablet TAKE 1 TABLET BY MOUTH 2 TIMES DAILY AS NEEDED FOR ANXIETY. 06/16/17   [provider]  diclofenac Sodium (VOLTAREN) 1 % GEL Apply 4 g topically 4 (four) times daily. 01/01/22   Melene Plan, DO  DULoxetine (CYMBALTA) 60 MG capsule Take 60 mg by mouth daily.    [provider]  EPINEPHrine 0.3 mg/0.3 mL IJ SOAJ injection USE FOR SEVERE ALLERGIC REACTIONS 04/22/22   Ambs, Norvel Richards, FNP  EPINEPHrine 0.3 mg/0.3 mL IJ SOAJ injection Inject 0.3 mg into the muscle as needed for anaphylaxis. 11/22/22   Nehemiah Settle, FNP  Esomeprazole Magnesium (NEXIUM PO) Take by mouth.    [provider]  fluticasone (FLOVENT HFA) 44 MCG/ACT inhaler Inhale into the lungs. 09/17/20   [provider]  HYDROcodone-acetaminophen (NORCO/VICODIN) 5-325 MG tablet Take 1 tablet by mouth every 6 (six) hours as needed. 12/07/20   [provider]  latanoprost (XALATAN) 0.005 % ophthalmic solution Place 1 drop into both eyes at bedtime. 08/27/14   [provider]  lidocaine (LIDODERM) 5 % Place 1 patch onto the skin daily. 05/23/20   [provider]  lisinopril (PRINIVIL,ZESTRIL) 10 MG tablet Take 40 mg by mouth daily.     [provider]  meclizine (ANTIVERT) 12.5 MG tablet Take 1 tablet by mouth 3 (three) times daily as needed. 05/28/20   [provider]  methocarbamol (ROBAXIN) 500 MG tablet Take 1 tablet (500 mg total) by mouth 2 (two) times daily. 01/01/22   Melene Plan, DO  montelukast (SINGULAIR) 10 MG tablet TAKE 1 TABLET (10 MG TOTAL) BY MOUTH AT BEDTIME. TO PREVENT COUGHING OR WHEEZING. 05/22/23   Nehemiah Settle, FNP  pravastatin (PRAVACHOL) 80 MG tablet Take 1 tablet by mouth daily. 11/09/20   [provider]      Allergies    Naproxen sodium, Shellfish allergy, Codeine, Prednisone, Sulfa antibiotics, and Levofloxacin    Review of Systems   Review of Systems  Physical Exam Updated Vital Signs BP (!) 145/67 (BP Location: Right Arm)   Pulse 66   Temp 98.2 F (36.8 C) (Oral)   Resp 20   Ht 5\' 1"  (1.549 m)   Wt 88.5 kg   SpO2 98%   BMI 36.84 kg/m  Physical Exam Vitals and nursing note reviewed.  Constitutional:      General: She is not in acute distress.    Appearance: She is well-developed. She is not ill-appearing.  HENT:     Head: Normocephalic and atraumatic.     Nose: Nose normal.     Mouth/Throat:     Mouth: Mucous membranes are moist.  Eyes:     Extraocular Movements: Extraocular movements intact.     Conjunctiva/sclera: Conjunctivae normal.     Pupils: Pupils are equal, round, and reactive to light.  Cardiovascular:     Rate and Rhythm: Normal rate and regular rhythm.     Pulses: Normal pulses.     Heart sounds: Normal heart sounds. No murmur heard. Pulmonary:     Effort: Pulmonary effort is normal. No respiratory distress.     Breath sounds: Normal breath sounds.  Abdominal:     General: Abdomen is flat.     Palpations: Abdomen is soft.     Tenderness: There is no abdominal tenderness.  Musculoskeletal:        General: No swelling.     Cervical back: Normal range of motion and neck supple.  Skin:    General: Skin is warm and dry.     Capillary Refill: Capillary refill takes less than 2 seconds.  Neurological:     General: No focal deficit present.     Mental Status: She is  alert and oriented to person, place, and time.     Cranial Nerves: No cranial nerve deficit.     Sensory: No sensory deficit.     Motor: No weakness.     Coordination: Coordination normal.     Comments: 5+ out of 5 strength throughout, normal sensation, no drift, normal finger-nose-finger, normal speech  Psychiatric:        Mood and Affect: Mood normal.     ED Results / Procedures / Treatments  Labs (all labs ordered are listed, but only abnormal results are displayed) Labs Reviewed - No data to display  EKG None  Radiology No results found.  Procedures Procedures    Medications Ordered in ED Medications - No data to display  ED Course/ Medical Decision Making/ A&P                                 Medical Decision Making Risk Prescription drug management.   Tanesha Arambula is here for neck pain.  History of cervical fusion in the past.  History of hypertension diabetes stroke.  She was seen yesterday for shortness of breath chest pain viral symptoms at [facility] had unremarkable workup per my review and interpretation.  She wanted to get her neck pain evaluated today feels better from a viral standpoint.  But sounds like they did not really address neck issues yesterday.  She is mostly concerned because she had an MRI a month ago and has not been able to go over the results with anybody from her neurologist.  Looks like she had an MRI of her brain neck and lumbar spine about a month ago and overall I do not see any major emergent or acute findings.  Sounds like she is having some acute on chronic pain in her neck.  Does not have any weakness or numbness.  Over-the-counter medications have not helped.  She has been on narcotic pain medicine in the past with good improvement as well as muscle relaxants.  I do think she is having referred pain is likely going to her upper back muscles as well and overall she is neurologically intact.  An EKG done that shows sinus rhythm.  No  ischemic changes.  Is not having any active chest pain or shortness of breath.  Her main concern today is her chronic neck pain and will put her on some Flexeril and oxycodone and have her follow-up with her spine team neurology team or primary care doctor team.  Discharged in good condition.  This chart was dictated using voice recognition software.  Despite best efforts to proofread,  errors can occur which can change the documentation meaning.         Final Clinical Impression(s) / ED Diagnoses Final diagnoses:  None    Rx / DC Orders ED Discharge Orders     None         Virgina Norfolk, DO 01/04/24 1529

## 2024-01-04 NOTE — Discharge Instructions (Signed)
 Follow-up with your spine doctor or neurologist and primary care doctor.  I have prescribed you narcotic pain medicine and muscle relaxant to help with your neck pain.  These medications are sedating so please be careful with its use.  Reviewing your MRI today I do not think there is any emergent finding but I do think you should follow-up with their primary doctor and spine doctor for chronic pain management.  Reviewing your lab work yesterday was also very reassuring and I think that you is having some referred neck pain and muscle spasms that are likely causing some your issues in the upper back.

## 2024-05-27 ENCOUNTER — Other Ambulatory Visit: Payer: Self-pay | Admitting: Family
